# Patient Record
Sex: Female | Born: 1970 | Race: White | Hispanic: No | Marital: Married | Smoking: Never smoker
Health system: Southern US, Community
[De-identification: ages and names within clinical notes are randomized; demographics above are authoritative.]

## PROBLEM LIST (undated history)

## (undated) DIAGNOSIS — I10 Essential (primary) hypertension: Secondary | ICD-10-CM

## (undated) DIAGNOSIS — G4700 Insomnia, unspecified: Secondary | ICD-10-CM

## (undated) DIAGNOSIS — F909 Attention-deficit hyperactivity disorder, unspecified type: Secondary | ICD-10-CM

## (undated) DIAGNOSIS — M797 Fibromyalgia: Secondary | ICD-10-CM

## (undated) DIAGNOSIS — G43909 Migraine, unspecified, not intractable, without status migrainosus: Secondary | ICD-10-CM

## (undated) DIAGNOSIS — N3010 Interstitial cystitis (chronic) without hematuria: Secondary | ICD-10-CM

## (undated) DIAGNOSIS — F329 Major depressive disorder, single episode, unspecified: Secondary | ICD-10-CM

## (undated) DIAGNOSIS — F419 Anxiety disorder, unspecified: Secondary | ICD-10-CM

## (undated) DIAGNOSIS — N950 Postmenopausal bleeding: Secondary | ICD-10-CM

## (undated) DIAGNOSIS — F32A Depression, unspecified: Secondary | ICD-10-CM

## (undated) DIAGNOSIS — U071 COVID-19: Secondary | ICD-10-CM

## (undated) DIAGNOSIS — Z87891 Personal history of nicotine dependence: Secondary | ICD-10-CM

## (undated) DIAGNOSIS — N840 Polyp of corpus uteri: Secondary | ICD-10-CM

## (undated) DIAGNOSIS — M069 Rheumatoid arthritis, unspecified: Secondary | ICD-10-CM

## (undated) DIAGNOSIS — K219 Gastro-esophageal reflux disease without esophagitis: Secondary | ICD-10-CM

## (undated) DIAGNOSIS — I471 Supraventricular tachycardia: Secondary | ICD-10-CM

## (undated) DIAGNOSIS — G5603 Carpal tunnel syndrome, bilateral upper limbs: Secondary | ICD-10-CM

## (undated) DIAGNOSIS — I4719 Other supraventricular tachycardia: Secondary | ICD-10-CM

## (undated) DIAGNOSIS — I7300 Raynaud's syndrome without gangrene: Secondary | ICD-10-CM

## (undated) DIAGNOSIS — T7840XA Allergy, unspecified, initial encounter: Secondary | ICD-10-CM

## (undated) HISTORY — DX: Depression, unspecified: F32.A

## (undated) HISTORY — PX: SPINE SURGERY: SHX786

## (undated) HISTORY — DX: Insomnia, unspecified: G47.00

## (undated) HISTORY — PX: BREAST SURGERY: SHX581

## (undated) HISTORY — PX: OTHER SURGICAL HISTORY: SHX169

## (undated) HISTORY — DX: Anxiety disorder, unspecified: F41.9

## (undated) HISTORY — DX: Migraine, unspecified, not intractable, without status migrainosus: G43.909

## (undated) HISTORY — DX: Supraventricular tachycardia: I47.1

## (undated) HISTORY — DX: Attention-deficit hyperactivity disorder, unspecified type: F90.9

## (undated) HISTORY — DX: Rheumatoid arthritis, unspecified: M06.9

## (undated) HISTORY — DX: Other supraventricular tachycardia: I47.19

## (undated) HISTORY — DX: Major depressive disorder, single episode, unspecified: F32.9

## (undated) HISTORY — PX: AUGMENTATION MAMMAPLASTY: SUR837

## (undated) HISTORY — DX: Allergy, unspecified, initial encounter: T78.40XA

## (undated) HISTORY — PX: CYSTOSCOPY: SUR368

## (undated) HISTORY — DX: Interstitial cystitis (chronic) without hematuria: N30.10

## (undated) HISTORY — DX: Essential (primary) hypertension: I10

## (undated) HISTORY — PX: COSMETIC SURGERY: SHX468

---

## 1998-05-18 ENCOUNTER — Encounter (HOSPITAL_COMMUNITY): Admission: RE | Admit: 1998-05-18 | Discharge: 1998-08-16 | Payer: Self-pay | Admitting: Obstetrics and Gynecology

## 1998-06-04 ENCOUNTER — Inpatient Hospital Stay (HOSPITAL_COMMUNITY): Admission: AD | Admit: 1998-06-04 | Discharge: 1998-06-04 | Payer: Self-pay | Admitting: Obstetrics and Gynecology

## 1998-06-08 ENCOUNTER — Inpatient Hospital Stay (HOSPITAL_COMMUNITY): Admission: AD | Admit: 1998-06-08 | Discharge: 1998-06-08 | Payer: Self-pay | Admitting: Obstetrics and Gynecology

## 1998-06-09 ENCOUNTER — Inpatient Hospital Stay (HOSPITAL_COMMUNITY): Admission: AD | Admit: 1998-06-09 | Discharge: 1998-06-11 | Payer: Self-pay | Admitting: Obstetrics and Gynecology

## 1999-08-27 ENCOUNTER — Encounter: Admission: RE | Admit: 1999-08-27 | Discharge: 1999-08-27 | Payer: Self-pay | Admitting: Obstetrics and Gynecology

## 1999-08-27 ENCOUNTER — Encounter: Payer: Self-pay | Admitting: Obstetrics and Gynecology

## 2002-01-13 ENCOUNTER — Other Ambulatory Visit: Admission: RE | Admit: 2002-01-13 | Discharge: 2002-01-13 | Payer: Self-pay | Admitting: Obstetrics and Gynecology

## 2003-03-18 ENCOUNTER — Other Ambulatory Visit: Admission: RE | Admit: 2003-03-18 | Discharge: 2003-03-18 | Payer: Self-pay | Admitting: Obstetrics and Gynecology

## 2004-10-28 DIAGNOSIS — Z8614 Personal history of Methicillin resistant Staphylococcus aureus infection: Secondary | ICD-10-CM

## 2004-10-28 HISTORY — DX: Personal history of Methicillin resistant Staphylococcus aureus infection: Z86.14

## 2004-11-12 ENCOUNTER — Other Ambulatory Visit: Admission: RE | Admit: 2004-11-12 | Discharge: 2004-11-12 | Payer: Self-pay | Admitting: Obstetrics and Gynecology

## 2004-11-19 ENCOUNTER — Ambulatory Visit: Payer: Self-pay | Admitting: Family Medicine

## 2005-02-27 ENCOUNTER — Emergency Department (HOSPITAL_COMMUNITY): Admission: EM | Admit: 2005-02-27 | Discharge: 2005-02-27 | Payer: Self-pay | Admitting: Emergency Medicine

## 2005-07-11 ENCOUNTER — Emergency Department (HOSPITAL_COMMUNITY): Admission: EM | Admit: 2005-07-11 | Discharge: 2005-07-11 | Payer: Self-pay | Admitting: *Deleted

## 2006-01-16 ENCOUNTER — Ambulatory Visit: Payer: Self-pay | Admitting: Family Medicine

## 2006-09-21 ENCOUNTER — Emergency Department (HOSPITAL_COMMUNITY): Admission: EM | Admit: 2006-09-21 | Discharge: 2006-09-21 | Payer: Self-pay | Admitting: Emergency Medicine

## 2006-11-20 ENCOUNTER — Emergency Department (HOSPITAL_COMMUNITY): Admission: EM | Admit: 2006-11-20 | Discharge: 2006-11-20 | Payer: Self-pay | Admitting: Family Medicine

## 2006-12-16 ENCOUNTER — Ambulatory Visit: Payer: Self-pay | Admitting: Family Medicine

## 2007-05-07 ENCOUNTER — Ambulatory Visit: Payer: Self-pay | Admitting: Family Medicine

## 2007-05-07 LAB — CONVERTED CEMR LAB
ALT: 19 units/L (ref 0–35)
AST: 22 units/L (ref 0–37)
Basophils Relative: 0 % (ref 0.0–1.0)
Bilirubin, Direct: 0.1 mg/dL (ref 0.0–0.3)
CO2: 28 meq/L (ref 19–32)
Calcium: 8.6 mg/dL (ref 8.4–10.5)
Chloride: 101 meq/L (ref 96–112)
Eosinophils Relative: 1.1 % (ref 0.0–5.0)
Glucose, Bld: 92 mg/dL (ref 70–99)
HCT: 36.2 % (ref 36.0–46.0)
Lymphocytes Relative: 25.9 % (ref 12.0–46.0)
Platelets: 207 10*3/uL (ref 150–400)
RBC: 4.01 M/uL (ref 3.87–5.11)
Total Bilirubin: 0.7 mg/dL (ref 0.3–1.2)
Total Protein: 7 g/dL (ref 6.0–8.3)
Triglycerides: 68 mg/dL (ref 0–149)
VLDL: 14 mg/dL (ref 0–40)
WBC: 4.7 10*3/uL (ref 4.5–10.5)

## 2007-06-26 ENCOUNTER — Telehealth: Payer: Self-pay | Admitting: Family Medicine

## 2007-08-05 ENCOUNTER — Telehealth: Payer: Self-pay | Admitting: Family Medicine

## 2007-08-10 ENCOUNTER — Telehealth: Payer: Self-pay | Admitting: Family Medicine

## 2007-08-15 ENCOUNTER — Emergency Department (HOSPITAL_COMMUNITY): Admission: EM | Admit: 2007-08-15 | Discharge: 2007-08-15 | Payer: Self-pay | Admitting: Family Medicine

## 2007-08-17 ENCOUNTER — Telehealth: Payer: Self-pay | Admitting: Family Medicine

## 2007-10-17 ENCOUNTER — Emergency Department (HOSPITAL_COMMUNITY): Admission: EM | Admit: 2007-10-17 | Discharge: 2007-10-17 | Payer: Self-pay | Admitting: Emergency Medicine

## 2007-11-03 ENCOUNTER — Telehealth: Payer: Self-pay | Admitting: Family Medicine

## 2007-11-30 ENCOUNTER — Telehealth: Payer: Self-pay | Admitting: Family Medicine

## 2007-12-01 ENCOUNTER — Ambulatory Visit: Payer: Self-pay | Admitting: Family Medicine

## 2007-12-01 DIAGNOSIS — F32A Depression, unspecified: Secondary | ICD-10-CM | POA: Insufficient documentation

## 2007-12-01 DIAGNOSIS — J309 Allergic rhinitis, unspecified: Secondary | ICD-10-CM | POA: Insufficient documentation

## 2007-12-01 DIAGNOSIS — F411 Generalized anxiety disorder: Secondary | ICD-10-CM | POA: Insufficient documentation

## 2007-12-01 DIAGNOSIS — F329 Major depressive disorder, single episode, unspecified: Secondary | ICD-10-CM

## 2007-12-01 DIAGNOSIS — F909 Attention-deficit hyperactivity disorder, unspecified type: Secondary | ICD-10-CM | POA: Insufficient documentation

## 2008-01-21 ENCOUNTER — Telehealth: Payer: Self-pay | Admitting: Family Medicine

## 2008-02-03 ENCOUNTER — Telehealth: Payer: Self-pay | Admitting: Family Medicine

## 2008-02-15 ENCOUNTER — Telehealth: Payer: Self-pay | Admitting: Family Medicine

## 2008-05-03 ENCOUNTER — Telehealth: Payer: Self-pay | Admitting: Family Medicine

## 2008-06-11 ENCOUNTER — Emergency Department (HOSPITAL_COMMUNITY): Admission: EM | Admit: 2008-06-11 | Discharge: 2008-06-11 | Payer: Self-pay | Admitting: Family Medicine

## 2008-06-15 ENCOUNTER — Emergency Department (HOSPITAL_COMMUNITY): Admission: EM | Admit: 2008-06-15 | Discharge: 2008-06-15 | Payer: Self-pay | Admitting: Family Medicine

## 2008-06-23 ENCOUNTER — Telehealth: Payer: Self-pay | Admitting: Family Medicine

## 2008-07-11 ENCOUNTER — Telehealth: Payer: Self-pay | Admitting: Family Medicine

## 2008-07-13 ENCOUNTER — Ambulatory Visit: Payer: Self-pay | Admitting: Family Medicine

## 2008-07-25 ENCOUNTER — Emergency Department (HOSPITAL_COMMUNITY): Admission: EM | Admit: 2008-07-25 | Discharge: 2008-07-25 | Payer: Self-pay | Admitting: Family Medicine

## 2008-08-23 ENCOUNTER — Telehealth: Payer: Self-pay | Admitting: *Deleted

## 2008-09-09 ENCOUNTER — Telehealth: Payer: Self-pay | Admitting: *Deleted

## 2008-12-20 ENCOUNTER — Telehealth: Payer: Self-pay | Admitting: Family Medicine

## 2009-02-27 ENCOUNTER — Ambulatory Visit: Payer: Self-pay | Admitting: Family Medicine

## 2009-02-27 DIAGNOSIS — G4700 Insomnia, unspecified: Secondary | ICD-10-CM | POA: Insufficient documentation

## 2009-04-19 ENCOUNTER — Telehealth: Payer: Self-pay | Admitting: Family Medicine

## 2009-04-26 ENCOUNTER — Ambulatory Visit: Payer: Self-pay | Admitting: Family Medicine

## 2009-04-28 ENCOUNTER — Telehealth: Payer: Self-pay | Admitting: Family Medicine

## 2009-05-22 ENCOUNTER — Telehealth: Payer: Self-pay | Admitting: Family Medicine

## 2009-05-24 ENCOUNTER — Ambulatory Visit: Payer: Self-pay | Admitting: Family Medicine

## 2009-05-25 ENCOUNTER — Ambulatory Visit (HOSPITAL_COMMUNITY): Admission: RE | Admit: 2009-05-25 | Discharge: 2009-05-25 | Payer: Self-pay | Admitting: Family Medicine

## 2009-05-30 ENCOUNTER — Telehealth: Payer: Self-pay | Admitting: Family Medicine

## 2009-06-06 ENCOUNTER — Ambulatory Visit (HOSPITAL_COMMUNITY): Admission: RE | Admit: 2009-06-06 | Discharge: 2009-06-06 | Payer: Self-pay | Admitting: Family Medicine

## 2009-06-07 ENCOUNTER — Telehealth: Payer: Self-pay | Admitting: Family Medicine

## 2009-06-12 ENCOUNTER — Telehealth: Payer: Self-pay | Admitting: Family Medicine

## 2009-06-13 ENCOUNTER — Encounter: Payer: Self-pay | Admitting: Family Medicine

## 2009-06-20 ENCOUNTER — Ambulatory Visit (HOSPITAL_COMMUNITY): Admission: RE | Admit: 2009-06-20 | Discharge: 2009-06-20 | Payer: Self-pay | Admitting: Neurosurgery

## 2009-06-22 ENCOUNTER — Encounter: Payer: Self-pay | Admitting: Family Medicine

## 2009-06-26 ENCOUNTER — Telehealth: Payer: Self-pay | Admitting: Family Medicine

## 2009-08-18 ENCOUNTER — Telehealth: Payer: Self-pay | Admitting: Family Medicine

## 2009-09-05 ENCOUNTER — Telehealth: Payer: Self-pay | Admitting: Family Medicine

## 2009-09-13 ENCOUNTER — Emergency Department (HOSPITAL_COMMUNITY): Admission: EM | Admit: 2009-09-13 | Discharge: 2009-09-13 | Payer: Self-pay | Admitting: Family Medicine

## 2010-01-03 ENCOUNTER — Telehealth: Payer: Self-pay | Admitting: Family Medicine

## 2010-01-19 ENCOUNTER — Encounter: Payer: Self-pay | Admitting: Family Medicine

## 2010-01-29 ENCOUNTER — Telehealth: Payer: Self-pay | Admitting: Family Medicine

## 2010-01-29 ENCOUNTER — Telehealth (INDEPENDENT_AMBULATORY_CARE_PROVIDER_SITE_OTHER): Payer: Self-pay

## 2010-02-05 ENCOUNTER — Telehealth: Payer: Self-pay | Admitting: Family Medicine

## 2010-02-08 ENCOUNTER — Ambulatory Visit: Payer: Self-pay | Admitting: Family Medicine

## 2010-02-13 ENCOUNTER — Telehealth: Payer: Self-pay | Admitting: Family Medicine

## 2010-03-01 ENCOUNTER — Encounter: Payer: Self-pay | Admitting: Family Medicine

## 2010-03-06 ENCOUNTER — Telehealth: Payer: Self-pay | Admitting: Family Medicine

## 2010-03-07 ENCOUNTER — Encounter: Payer: Self-pay | Admitting: Family Medicine

## 2010-03-30 ENCOUNTER — Telehealth: Payer: Self-pay | Admitting: Family Medicine

## 2010-07-25 ENCOUNTER — Telehealth: Payer: Self-pay | Admitting: Family Medicine

## 2010-08-17 ENCOUNTER — Encounter: Payer: Self-pay | Admitting: Family Medicine

## 2010-10-12 ENCOUNTER — Telehealth: Payer: Self-pay | Admitting: Family Medicine

## 2010-10-12 ENCOUNTER — Ambulatory Visit: Payer: Self-pay | Admitting: Family Medicine

## 2010-10-23 ENCOUNTER — Telehealth: Payer: Self-pay | Admitting: Family Medicine

## 2010-11-13 ENCOUNTER — Ambulatory Visit
Admission: RE | Admit: 2010-11-13 | Discharge: 2010-11-13 | Payer: Self-pay | Source: Home / Self Care | Attending: Family Medicine | Admitting: Family Medicine

## 2010-11-29 NOTE — Assessment & Plan Note (Signed)
Summary: diarrhea/stomach pain/cjr   Vital Signs:  Patient profile:   40 year old female Weight:      127 pounds O2 Sat:      80 % Temp:     98.2 degrees F Pulse rate:   95 / minute BP sitting:   120 / 80  (left arm) Cuff size:   regular  Vitals Entered By: Pura Spice, RN (November 13, 2010 2:39 PM) CC: nausea diarrhea   History of Present Illness: Here with 3 days of nausea and vomiting and diarrhea . She feels much better today, drinking fluids. No fever. Also she asks about a med for anxiety. She had used Lexapro in the past, but now she wants to try a med that she can use on an as needed basis only. She has stopped all her ADHD meds as well, and has done very well.   Allergies: 1)  ! Darvocet 2)  ! Vicodin 3)  ! Vioxx  Past History:  Past Medical History: Reviewed history from 02/27/2009 and no changes required. Allergic rhinitis Anxiety Depression ADHD insomnia  Past Surgical History: Reviewed history from 12/01/2007 and no changes required. Breast implants 1996  Review of Systems  The patient denies anorexia, fever, weight loss, weight gain, vision loss, decreased hearing, hoarseness, chest pain, syncope, dyspnea on exertion, peripheral edema, prolonged cough, headaches, hemoptysis, abdominal pain, melena, hematochezia, severe indigestion/heartburn, hematuria, incontinence, genital sores, muscle weakness, suspicious skin lesions, transient blindness, difficulty walking, depression, unusual weight change, abnormal bleeding, enlarged lymph nodes, angioedema, breast masses, and testicular masses.    Physical Exam  General:  Well-developed,well-nourished,in no acute distress; alert,appropriate and cooperative throughout examination Abdomen:  Bowel sounds positive,abdomen soft and non-tender without masses, organomegaly or hernias noted. Psych:  Cognition and judgment appear intact. Alert and cooperative with normal attention span and concentration. No apparent  delusions, illusions, hallucinations   Impression & Recommendations:  Problem # 1:  GASTROENTERITIS, VIRAL (ICD-008.8)  Problem # 2:  ADHD (ICD-314.01)  Problem # 3:  ANXIETY (ICD-300.00)  The following medications were removed from the medication list:    Lexapro 10 Mg Tabs (Escitalopram oxalate) .Marland Kitchen... 1 once daily Her updated medication list for this problem includes:    Ativan 1 Mg Tabs (Lorazepam) .Marland Kitchen... Three times a day as needed anxiety  Complete Medication List: 1)  Ambien 10 Mg Tabs (Zolpidem tartrate) .... At bedtime 2)  Valtrex 500 Mg Tabs (Valacyclovir hcl) .... One by mouth bid 3)  Diflucan 100 Mg Tabs (Fluconazole) .Marland Kitchen.. 1 by mouth two times a day as needed 4)  Flexeril 10 Mg Tabs (Cyclobenzaprine hcl) .... Three times a day as needed spasm 5)  Ativan 1 Mg Tabs (Lorazepam) .... Three times a day as needed anxiety  Patient Instructions: 1)  Drink fluids, rest. Try Lorazepam.  Prescriptions: ATIVAN 1 MG TABS (LORAZEPAM) three times a day as needed anxiety  #90 x 0   Entered and Authorized by:   Nelwyn Salisbury MD   Signed by:   Nelwyn Salisbury MD on 11/13/2010   Method used:   Print then Give to Patient   RxID:   [id number]    Orders Added: 1)  Est. Patient Level IV [41324]

## 2010-11-29 NOTE — Progress Notes (Signed)
Summary: pharmacy update  Phone Note Call from Patient Call back at Home Phone [phone]   Caller: Patient---live call Summary of Call: please call zpak to [facility] Initial call taken by: Warnell Forester,  January 29, 2010 2:34 PM    Prescriptions: Christena Deem Z-PAK 250 MG TABS (AZITHROMYCIN) as directed  #1 x 0   Entered by:   Duard Brady LPN   Authorized by:   Nelwyn Salisbury MD   Signed by:   Duard Brady LPN on 09/81/1914   Method used:   Electronically to        [facility]* (retail)       [address]       [address] Shipping/mailing       [place], [place]  [address]       Ph: [phone]       Fax: [phone]   RxID:   [id number]  [facility] KIK

## 2010-11-29 NOTE — Progress Notes (Signed)
Summary: Lexapro  Phone Note Call from Patient   Summary of Call: Pt would like to go back on Lexapro 10 mg with a 90 day supply to [facility] and call pt when done. [phone] Initial call taken by: Lynann Beaver CMA,  February 05, 2010 10:13 AM  Follow-up for Phone Call        call in #90 with 3 rf Follow-up by: Nelwyn Salisbury MD,  February 05, 2010 3:29 PM  Additional Follow-up for Phone Call Additional follow up Details #1::        Rx Called In Additional Follow-up by: Raechel Ache, RN,  February 05, 2010 4:19 PM    New/Updated Medications: LEXAPRO 10 MG TABS (ESCITALOPRAM OXALATE) 1 once daily Prescriptions: LEXAPRO 10 MG TABS (ESCITALOPRAM OXALATE) 1 once daily  #90 x 3   Entered by:   Raechel Ache, RN   Authorized by:   Nelwyn Salisbury MD   Signed by:   Raechel Ache, RN on 02/05/2010   Method used:   Electronically to        [facility]* (retail)       [address]       [address] Shipping/mailing       [place], [place]  [address]       Ph: [phone]       Fax: [phone]   RxID:   [id number]

## 2010-11-29 NOTE — Letter (Signed)
Summary: [facility]  [facility]   Imported By: Maryln Gottron 08/21/2010 10:38:45  _____________________________________________________________________  External Attachment:    Type:   Image     Comment:   External Document

## 2010-11-29 NOTE — Letter (Signed)
Summary: [facility]  [facility]   Imported By: Maryln Gottron 03/14/2010 13:33:32  _____________________________________________________________________  External Attachment:    Type:   Image     Comment:   External Document

## 2010-11-29 NOTE — Consult Note (Signed)
Summary: [facility]  [facility]   Imported By: Maryln Gottron 03/07/2010 14:48:00  _____________________________________________________________________  External Attachment:    Type:   Image     Comment:   External Document

## 2010-11-29 NOTE — Progress Notes (Signed)
Summary: zpak  Phone Note Call from Patient Call back at Home Phone [phone]   Caller: vm Summary of Call: Requesting Zpak again for sinus infection.  Happens every 4 - 5 months.  [facility] OP.  Please let me know.   Initial call taken by: Rudy Jew, RN,  January 29, 2010 12:35 PM  Follow-up for Phone Call        call in a Zpack Follow-up by: Nelwyn Salisbury MD,  January 29, 2010 1:12 PM  Additional Follow-up for Phone Call Additional follow up Details #1::        attempt to call - ans mach - LMTCB if needed , Dr. Clent Ridges ok'd Z-pak to be called out , [facility]. if no inprovement after abx - may need to be seen .KIK Additional Follow-up by: Duard Brady LPN,  January 29, 2010 1:47 PM    New/Updated Medications: ZITHROMAX Z-PAK 250 MG TABS (AZITHROMYCIN) as directed Prescriptions: ZITHROMAX Z-PAK 250 MG TABS (AZITHROMYCIN) as directed  #1 x 0   Entered by:   Duard Brady LPN   Authorized by:   Nelwyn Salisbury MD   Signed by:   Duard Brady LPN on 65/78/4696   Method used:   [facility] [phone]* (retail)       [address]       [place], [place]  [address]       Ph: [phone] or [phone]       Fax: [phone]   RxID:   [id number]

## 2010-11-29 NOTE — Progress Notes (Signed)
Summary: REFILL  Phone Note Refill Request Call back at Home Phone [phone] Message from:  Patient---LIVE CALL  Refills Requested: Medication #1:  VALTREX 500 MG TABS one by mouth bid [facility]. REQUESTING A FULL RX. IT IS CHEAPER THAN 2 OR 3 PILLLS AT A TIME. THANKS.  Initial call taken by: Warnell Forester,  January 03, 2010 8:07 AM  Follow-up for Phone Call        Rx called to pharmacy Follow-up by: Alfred Levins, CMA,  January 03, 2010 10:06 AM    Prescriptions: VALTREX 500 MG TABS (VALACYCLOVIR HCL) one by mouth bid  #60 x 11   Entered by:   Alfred Levins, CMA   Authorized by:   Nelwyn Salisbury MD   Signed by:   Alfred Levins, CMA on 01/03/2010   Method used:   Electronically to        [facility]* (retail)       [address]       [address] Shipping/mailing       [place], [place]  [address]       Ph: [phone]       Fax: [phone]   RxID:   [id number]

## 2010-11-29 NOTE — Assessment & Plan Note (Signed)
Summary: fup meds//ccm   Vital Signs:  Patient profile:   40 year old female Weight:      133 pounds BP sitting:   126 / 78  (left arm) Cuff size:   regular  Vitals Entered By: Raechel Ache, RN (February 08, 2010 10:59 AM) CC: Talk about meds- depressed.   History of Present Illness: Here to follow up on anxiety, depression, and ADHD. She is still going through her divorce, and of course this has been difficult, Plus she was recently told by her 44 year old son that he now wants to live with his father instead of her. This was very difficult news for her to deal with. She has been very tearful, can't relax, can't sleep, can't focus on her work. She would like for [place] to start treating her ADHD again also.   Allergies: 1)  ! Darvocet 2)  ! Vicodin 3)  ! Vioxx  Past History:  Past Medical History: Reviewed history from 02/27/2009 and no changes required. Allergic rhinitis Anxiety Depression ADHD insomnia  Review of Systems  The patient denies anorexia, fever, weight loss, weight gain, vision loss, decreased hearing, hoarseness, chest pain, syncope, dyspnea on exertion, peripheral edema, prolonged cough, headaches, hemoptysis, abdominal pain, melena, hematochezia, severe indigestion/heartburn, hematuria, incontinence, genital sores, muscle weakness, suspicious skin lesions, transient blindness, difficulty walking, unusual weight change, abnormal bleeding, enlarged lymph nodes, angioedema, breast masses, and testicular masses.    Physical Exam  General:  Well-developed,well-nourished,in no acute distress; alert,appropriate and cooperative throughout examination Psych:  Oriented X3, memory intact for recent and remote, normally interactive, good eye contact, tearful, and slightly anxious.     Impression & Recommendations:  Problem # 1:  ADHD (ICD-314.01)  Problem # 2:  DEPRESSION (ICD-311)  Her updated medication list for this problem includes:    Lexapro 10 Mg Tabs  (Escitalopram oxalate) .Marland Kitchen... 1 once daily  Problem # 3:  ANXIETY (ICD-300.00)  Her updated medication list for this problem includes:    Lexapro 10 Mg Tabs (Escitalopram oxalate) .Marland Kitchen... 1 once daily  Complete Medication List: 1)  Ambien 10 Mg Tabs (Zolpidem tartrate) .... 2 at bedtime 2)  Valtrex 500 Mg Tabs (Valacyclovir hcl) .... One by mouth bid 3)  Diflucan 100 Mg Tabs (Fluconazole) .Marland Kitchen.. 1 by mouth two times a day as needed 4)  Flexeril 10 Mg Tabs (Cyclobenzaprine hcl) .... Three times a day as needed spasm 5)  Adderall Xr 20 Mg Xr24h-cap (Amphetamine-dextroamphetamine) .... Once daily 6)  Lexapro 10 Mg Tabs (Escitalopram oxalate) .Marland Kitchen.. 1 once daily  Patient Instructions: 1)  Please schedule a follow-up appointment in 1 month.  Prescriptions: ADDERALL XR 20 MG XR24H-CAP (AMPHETAMINE-DEXTROAMPHETAMINE) once daily  #90 x 0   Entered and Authorized by:   Nelwyn Salisbury MD   Signed by:   Nelwyn Salisbury MD on 02/08/2010   Method used:   Print then Give to Patient   RxID:   [id number]

## 2010-11-29 NOTE — Progress Notes (Signed)
Summary: antibiotic & pain med  Phone Note Call from Patient Call back at [facility] Phone [phone]   Summary of Call: Can you call in Rx for bladder infection?   Frequency, burning, spasm, as before, & Pain med, 10 on scale 1 - 10.  2 weeks, has ignored it and now really bad.  Sinus infection antibiotic didn't help as she thought it would.  [facility] OP.  [facility]DA.  She wants a call back either way.   Initial call taken by: Rudy Jew, RN,  February 13, 2010 9:50 AM  Follow-up for Phone Call        call in Bactrim DS two times a day for 7 days, also Pyridium 200mg  three times a day as needed urinary pain, #30 with 2 rf Follow-up by: Nelwyn Salisbury MD,  February 13, 2010 1:06 PM  Additional Follow-up for Phone Call Additional follow up Details #1::        Phone Call Completed.  Left message patient's number to inform.   Additional Follow-up by: Rudy Jew, RN,  February 13, 2010 2:13 PM    New/Updated Medications: BACTRIM DS 800-160 MG TABS (SULFAMETHOXAZOLE-TRIMETHOPRIM) One two times a day for 7 days PYRIDIUM 200 MG TABS (PHENAZOPYRIDINE HCL) One three times a day as needed urinary pain Prescriptions: PYRIDIUM 200 MG TABS (PHENAZOPYRIDINE HCL) One three times a day as needed urinary pain  #30 x 2   Entered by:   Rudy Jew, RN   Authorized by:   Nelwyn Salisbury MD   Signed by:   Rudy Jew, RN on 02/13/2010   Method used:   Electronically to        [facility]* (retail)       [address]       [address] Shipping/mailing       [place], [place]  [address]       Ph: [phone]       Fax: [phone]   RxID:   [id number] BACTRIM DS 800-160 MG TABS (SULFAMETHOXAZOLE-TRIMETHOPRIM) One two times a day for 7 days  #14 x 0   Entered by:   Rudy Jew, RN   Authorized by:   Nelwyn Salisbury MD   Signed by:   Rudy Jew, RN on 02/13/2010   Method used:   Electronically to        [facility]* (retail)  [address]       [address] Shipping/mailing       [place], [place]  [address]       Ph: [phone]       Fax: [phone]   RxID:   [id number]

## 2010-11-29 NOTE — Progress Notes (Signed)
Summary: sinus infection  Phone Note Call from Patient   Caller: Patient Call For: Nelwyn Salisbury MD Reason for Call: Acute Illness Complaint: Cough/Sore throat, Headache Action Taken: Provider Notified Summary of Call: Pt is having congestion in face and teeth pain.  Runny nose.  x 2 weeks.  Green mucus from nose.  [name]  [address]  [phone] Initial call taken by: Lynann Beaver CMA,  July 25, 2010 8:38 AM  Follow-up for Phone Call        call in a Zpack Follow-up by: Nelwyn Salisbury MD,  July 25, 2010 9:11 AM  Additional Follow-up for Phone Call Additional follow up Details #1::        done  pt notified--lmom that rx had been  called in,  Additional Follow-up by: Pura Spice, RN,  July 25, 2010 9:54 AM    New/Updated Medications: AZITHROMYCIN 250 MG  TABS (AZITHROMYCIN) 2 by  mouth today and then 1 daily for 4 days Prescriptions: AZITHROMYCIN 250 MG  TABS (AZITHROMYCIN) 2 by  mouth today and then 1 daily for 4 days  #6 x 0   Entered by:   Pura Spice, RN   Authorized by:   Nelwyn Salisbury MD   Signed by:   Pura Spice, RN on 07/25/2010   Method used:   Electronically to        [id number] (retail)       [address]       [place], [place]  [address]       Ph: [phone]       Fax: [phone]   RxID:   [id number]

## 2010-11-29 NOTE — Progress Notes (Signed)
Summary: Zolpidem refill request  Phone Note Call from Patient Call back at Home Phone [phone]   Caller: Patient Call For: Martha Salisbury MD Summary of Call: Pt requesting refill Zolpidem.  She is completely out [facility] Initial call taken by: Sid Falcon LPN,  October 23, 2010 3:53 PM  Follow-up for Phone Call        call in #60 with 5 rf  Follow-up by: Martha Salisbury MD,  October 23, 2010 5:21 PM  Additional Follow-up for Phone Call Additional follow up Details #1::        Rx called, pt informed Additional Follow-up by: Sid Falcon LPN,  October 23, 2010 5:32 PM    Prescriptions: AMBIEN 10 MG TABS (ZOLPIDEM TARTRATE) 2 at bedtime  #60 x 5   Entered by:   Sid Falcon LPN   Authorized by:   Martha Salisbury MD   Signed by:   Sid Falcon LPN on 09/81/1914   Method used:   Telephoned to ...       [facility]  [phone]* (retail)       [address] [place], [place]  [address]       Ph: [phone] or [phone]       Fax: [phone]   RxID:   [id number]

## 2010-11-29 NOTE — Letter (Signed)
Summary: [facility]  [facility]   Imported By: Maryln Gottron 01/29/2010 15:41:39  _____________________________________________________________________  External Attachment:    Type:   Image     Comment:   External Document

## 2010-11-29 NOTE — Progress Notes (Signed)
Summary: vyvanse  Phone Note Call from Patient Call back at Home Phone [phone]   Caller: vm Summary of Call: Requests to be taken off Adderall & be put on Vyvanse. It causes heart palpitations and jittery in pms.  She will pick up Rx. Initial call taken by: Rudy Jew, RN,  March 30, 2010 2:27 PM  Follow-up for Phone Call        done  Follow-up by: Nelwyn Salisbury MD,  March 30, 2010 3:30 PM    New/Updated Medications: VYVANSE 20 MG CAPS (LISDEXAMFETAMINE DIMESYLATE) once daily Prescriptions: VYVANSE 20 MG CAPS (LISDEXAMFETAMINE DIMESYLATE) once daily  #30 x 0   Entered and Authorized by:   Nelwyn Salisbury MD   Signed by:   Nelwyn Salisbury MD on 03/30/2010   Method used:   Print then Give to Patient   RxID:   [id number]

## 2010-11-29 NOTE — Progress Notes (Signed)
Summary: Pt wants to change from Adderall to VyvanseLMTCB 5-10  Phone Note Call from Patient Call back at [facility] Phone [phone]   Caller: Patient Summary of Call: Pt called and said that Adderall needs to be changed to Vyvanse, because Adderall is not XR like Vyanse is.   Please call when ready for pick up.  Initial call taken by: Lucy Antigua,  Mar 06, 2010 9:55 AM  Follow-up for Phone Call        No, I did give her XR Adderall already Follow-up by: Nelwyn Salisbury MD,  Mar 06, 2010 11:57 AM  Additional Follow-up for Phone Call Additional follow up Details #1::        Left message to call back. Raelene Bott Spell, RN  Mar 06, 2010 12:48 PM  Spoke to pt regarding meds, and she is needing refills on Adderal.   Additional Follow-up by: Lynann Beaver CMA,  Mar 07, 2010 10:12 AM    Additional Follow-up for Phone Call Additional follow up Details #2::    Needs refill on Adderal XR.  Additional Follow-up for Phone Call Additional follow up Details #3:: Details for Additional Follow-up Action Taken: done for one month Additional Follow-up by: Nelwyn Salisbury MD,  Mar 07, 2010 12:17 PM  Prescriptions: ADDERALL XR 20 MG XR24H-CAP (AMPHETAMINE-DEXTROAMPHETAMINE) once daily  #30 x 0   Entered and Authorized by:   Nelwyn Salisbury MD   Signed by:   Nelwyn Salisbury MD on 03/07/2010   Method used:   Print then Give to Patient   RxID:   [id number]

## 2010-11-29 NOTE — Progress Notes (Signed)
Summary: ? hand broken  Phone Note Call from Patient Call back at Home Phone [phone]   Caller: Patient--live call Summary of Call: thinks she may have broken her hand. Has appt with Dr Clent Ridges for 2:45 pm today. please advise patient. Initial call taken by: Warnell Forester,  October 12, 2010 9:06 AM  Follow-up for Phone Call        see if [facility] work her [facility] today for hand injury Follow-up by: Nelwyn Salisbury MD,  October 12, 2010 9:25 AM  Additional Follow-up for Phone Call Additional follow up Details #1::        I called [facility] and they said that they could get her in but she needs to discuss this with Misty Stanley in billing before they could see her.  Pt aware of this and will call them. Appt cancelled with Dr. Clent Ridges for today. Additional Follow-up by: Romualdo Bolk, CMA Duncan Dull),  October 12, 2010 9:36 AM    Additional Follow-up for Phone Call Additional follow up Details #2::    noted Follow-up by: Nelwyn Salisbury MD,  October 12, 2010 9:58 AM

## 2010-12-09 ENCOUNTER — Inpatient Hospital Stay (INDEPENDENT_AMBULATORY_CARE_PROVIDER_SITE_OTHER)
Admission: RE | Admit: 2010-12-09 | Discharge: 2010-12-09 | Disposition: A | Discharge status: Home / Self Care | Payer: 59 | Source: Ambulatory Visit | Attending: Emergency Medicine | Admitting: Emergency Medicine

## 2010-12-09 DIAGNOSIS — S20229A Contusion of unspecified back wall of thorax, initial encounter: Secondary | ICD-10-CM

## 2010-12-12 ENCOUNTER — Telehealth: Payer: Self-pay | Admitting: Family Medicine

## 2010-12-12 MED ORDER — AZITHROMYCIN 250 MG PO TABS: ORAL_TABLET | ORAL | Status: AC

## 2010-12-12 NOTE — Telephone Encounter (Signed)
Requesting antibiotic for sinus infection.  Please send to [facility].

## 2010-12-12 NOTE — Telephone Encounter (Signed)
done

## 2010-12-20 ENCOUNTER — Ambulatory Visit (INDEPENDENT_AMBULATORY_CARE_PROVIDER_SITE_OTHER): Payer: 59 | Admitting: Family Medicine

## 2010-12-20 ENCOUNTER — Encounter: Payer: Self-pay | Admitting: Family Medicine

## 2010-12-20 ENCOUNTER — Ambulatory Visit (INDEPENDENT_AMBULATORY_CARE_PROVIDER_SITE_OTHER)
Admission: RE | Admit: 2010-12-20 | Discharge: 2010-12-20 | Disposition: A | Discharge status: Home / Self Care | Payer: 59 | Source: Ambulatory Visit | Attending: Family Medicine | Admitting: Family Medicine

## 2010-12-20 VITALS — BP 110/80 | HR 109 | Temp 98.0°F | Wt 126.0 lb

## 2010-12-20 DIAGNOSIS — M545 Low back pain, unspecified: Secondary | ICD-10-CM

## 2010-12-20 NOTE — Progress Notes (Signed)
  Subjective:    Patient ID: Martha [name], female    DOB: April 01, 1971, 40 y.o.   MRN: [id number]  HPI Here for low back pain which is still present one month after an injury. On 11-17-10 while getting out of the bathtub at home, she slipped and fell, landing on her lower back and right buttock. She has some swelling which went away after a week or so, but she had a lot of pain in this area that persists. She was seen at Urgent Care on 12-09-10, and was told it was just a contusion. No Xrays were obtained. She was given a steroid dose pack and Percocet. She did not take the Percocet but did take the steroid pack. This helped a tiny bit only. Now she is taking Motrin which helps a little better. The pain has improved slowly but it still bothers her a lot. No pain down the legs, no numbness or weakness.    Review of Systems  Constitutional: Negative.   Musculoskeletal: Negative for myalgias, joint swelling, arthralgias and gait problem.  Neurological: Negative for weakness and numbness.       Objective:   Physical Exam  Constitutional: She appears well-developed and well-nourished.       Gets up and down from the exam table easily   Musculoskeletal:       Very tender over the right lower back at the level of L5 or S1. No spasm. Full ROM. Negative SLR           Assessment & Plan:  This appears to be simply a deep bruise that is lowly healing. We will get Xrays today to rule out fractures. She will switch to taking 2 Aleeve tablets twice a day. Continue light duty at work for another week.

## 2010-12-21 ENCOUNTER — Telehealth: Payer: Self-pay | Admitting: Family Medicine

## 2010-12-21 NOTE — Telephone Encounter (Signed)
Pt notified xr results

## 2010-12-21 NOTE — Telephone Encounter (Signed)
Normal

## 2010-12-21 NOTE — Telephone Encounter (Signed)
Pt called to request results of recent xrays.... Pt can be reached at [phone].

## 2010-12-27 ENCOUNTER — Other Ambulatory Visit: Payer: Self-pay | Admitting: Family Medicine

## 2010-12-28 ENCOUNTER — Telehealth: Payer: Self-pay | Admitting: *Deleted

## 2010-12-28 MED ORDER — CIPROFLOXACIN HCL 500 MG PO TABS: 500.0000 mg | ORAL_TABLET | Freq: Two times a day (BID) | ORAL | Status: AC

## 2010-12-28 NOTE — Telephone Encounter (Signed)
Pt has a UTI and wants a rx called into [facility]

## 2010-12-28 NOTE — Telephone Encounter (Signed)
Call in #90 with 5 rf 

## 2010-12-28 NOTE — Telephone Encounter (Signed)
Pt aware.

## 2010-12-28 NOTE — Telephone Encounter (Signed)
Tell her these have been emailed in

## 2011-01-02 ENCOUNTER — Telehealth: Payer: Self-pay | Admitting: Family Medicine

## 2011-01-02 NOTE — Telephone Encounter (Signed)
Triage vm-----Dr Clent Ridges suggested light duty. She has been on light duty. [facility] needs a letter stating that she was on light duty under his care from last week to this week. Can pick up letter tomorrow.  Refill Lorazepam @ [facility]

## 2011-01-02 NOTE — Telephone Encounter (Signed)
Please fax in such a work note,also call in  6 month supply of Lorazepam

## 2011-01-03 ENCOUNTER — Other Ambulatory Visit: Payer: Self-pay | Admitting: Family Medicine

## 2011-01-03 NOTE — Telephone Encounter (Signed)
rx called in and letter ready

## 2011-01-03 NOTE — Telephone Encounter (Signed)
Pt called to adv she needs a refill on med: Lorazepam 1mg , Psychologist, forensic - [facility]...Marland KitchenMarland KitchenMarland Kitchen Pt also needs a note explaining that she was on light duty last week and this week for her back.... Pt will p/u note once it is ready... Pt # [id number].

## 2011-01-07 ENCOUNTER — Other Ambulatory Visit: Payer: Self-pay | Admitting: Family Medicine

## 2011-01-07 NOTE — Telephone Encounter (Signed)
Martha [name] called and told patient on last Thursday to tell her that her Lorazepam was ready at [facility]. As of today, no rx is there. Wants [name] to return call.

## 2011-01-08 MED ORDER — LORAZEPAM 1 MG PO TABS: 1.0000 mg | ORAL_TABLET | Freq: Three times a day (TID) | ORAL | Status: DC | PRN

## 2011-01-08 NOTE — Telephone Encounter (Signed)
Left mess on cell phone med called in again as [facility]  [facility] rx given

## 2011-01-30 LAB — POCT I-STAT, CHEM 8
Creatinine, Ser: 0.7 mg/dL (ref 0.4–1.2)
Glucose, Bld: 94 mg/dL (ref 70–99)
Hemoglobin: 13.6 g/dL (ref 12.0–15.0)
TCO2: 26 mmol/L (ref 0–100)

## 2011-02-04 ENCOUNTER — Telehealth: Payer: Self-pay | Admitting: Family Medicine

## 2011-02-04 MED ORDER — ZOLPIDEM TARTRATE 10 MG PO TABS: 10.0000 mg | ORAL_TABLET | Freq: Every evening | ORAL | Status: DC | PRN

## 2011-02-04 NOTE — Telephone Encounter (Signed)
Call in 10 mg, one qhs, #30 with 5 rf

## 2011-02-04 NOTE — Telephone Encounter (Signed)
Pt aware  rx called,

## 2011-02-04 NOTE — Telephone Encounter (Signed)
Pt lost her rx that she had filled for Zolpidem, while her car was been detailed (Cleaning). [facility]. She said that it was not stolen, just accidentialy thrown away. Please call her when done. She is out of meds.

## 2011-02-11 ENCOUNTER — Encounter (HOSPITAL_COMMUNITY): Payer: 59

## 2011-02-11 ENCOUNTER — Other Ambulatory Visit (HOSPITAL_COMMUNITY): Payer: 59

## 2011-03-12 NOTE — Assessment & Plan Note (Signed)
[facility] OFFICE NOTE   NAME:Martha [name], Martha [name]                       MRN:          [id number]  DATE:05/07/2007                            DOB:          [birth year]    This is a 40 year old woman here for a non-gynecological physical  examination. 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 She feels fine and has no complaints at  all. She continues to see Dr. Ambrose Mantle on a regular basis for gynecology  exams. Her anxiety remains under good control, as do her migraine  headaches. For details for her past medical, family history, social  history, habits, etcetera, I refer you to our introductory note with her  dated January 06, 2002.   ALLERGIES:  DARVOCET, VICODIN, and VIOXX.   CURRENT MEDICATIONS:  1. Lexapro 10 mg daily.  2. Ambien 10 mg nightly as needed for sleep.  3. Zomig 5 mg as needed for migraines.   OBJECTIVE:  VITAL SIGNS:  Height 5 foot 7 inches, weight 171, blood  pressure 118/82, pulse 72 and regular.  GENERAL:  She appears healthy.  SKIN:  Clear.  EYES:  Clear. Vision uncorrected in both eyes is 20/13.  EARS:  Hearing is grossly normal. Ears are clear.  OROPHARYNX:  Clear.  NECK:  Supple without lymphadenopathy or masses.  LUNGS:  Clear.  CARDIAC:  Rhythm is regular with no murmurs, rubs, or gallops. Distal  pulses are full.  ABDOMEN:  Soft, normal bowel sounds, nontender, no masses.  EXTREMITIES:  No cyanosis, clubbing, or edema.  NEUROLOGIC:  Grossly intact.   ASSESSMENT AND PLAN:  1. Complete physical. She is fasting, so we will send her for the      usual laboratories.  2. Application for classes as above. All of her forms were filled out.      She is up-to-date on immunizations, although she will get her third      and final hepatitis B shot at work on July 18.  3. Anxiety,  stable.  4. Insomnia, stable.  5. Migraine headaches, stable.    Tera Mater. Clent Ridges, MD  Electronically Signed   SAF/MedQ  DD: 05/07/2007  DT: 05/08/2007  Job #: [id number]

## 2011-05-07 ENCOUNTER — Other Ambulatory Visit: Payer: Self-pay | Admitting: Family Medicine

## 2011-05-08 NOTE — Telephone Encounter (Signed)
Pt last here on 12/20/10 and last filled on 02/04/11

## 2011-05-10 NOTE — Telephone Encounter (Signed)
Dr fry should do long term refills  If needs some to get by until her returns  then can rx #20  Pills of ambien 10 mg

## 2011-05-13 NOTE — Telephone Encounter (Signed)
Refill request for Zolpidem 10 mg, pt last here on 12/20/10 and script last filled on 02/04/11.

## 2011-05-14 NOTE — Telephone Encounter (Signed)
She takes 2 qhs, so call in #180 with one rf

## 2011-05-26 ENCOUNTER — Inpatient Hospital Stay (INDEPENDENT_AMBULATORY_CARE_PROVIDER_SITE_OTHER)
Admission: RE | Admit: 2011-05-26 | Discharge: 2011-05-26 | Disposition: A | Discharge status: Home / Self Care | Payer: 59 | Source: Ambulatory Visit | Attending: Family Medicine | Admitting: Family Medicine

## 2011-05-26 DIAGNOSIS — B009 Herpesviral infection, unspecified: Secondary | ICD-10-CM

## 2011-05-30 ENCOUNTER — Telehealth: Payer: Self-pay | Admitting: Family Medicine

## 2011-05-30 ENCOUNTER — Ambulatory Visit (INDEPENDENT_AMBULATORY_CARE_PROVIDER_SITE_OTHER): Payer: 59 | Admitting: Family Medicine

## 2011-05-30 ENCOUNTER — Encounter: Payer: Self-pay | Admitting: Family Medicine

## 2011-05-30 DIAGNOSIS — R079 Chest pain, unspecified: Secondary | ICD-10-CM

## 2011-05-30 DIAGNOSIS — B029 Zoster without complications: Secondary | ICD-10-CM

## 2011-05-30 NOTE — Telephone Encounter (Signed)
Pt is faxing over FMLA papers. Pt requesting DR. Clent Ridges to include next week as well on the papers incase she is feel no better. Pt is faxing today.

## 2011-05-30 NOTE — Progress Notes (Signed)
  Subjective:    Patient ID: Martha [name], female    DOB: 12-01-1970, 40 y.o.   MRN: [id number]  HPI Here to follow up on shingles and for recent chest pressure and pain. About one week ago she developed some burning and tingling in the mouth and the left side of her face. This became more severe and then she noticed a few blisters come up on the lower lip. This was also associated with sharp pains down the left arm. She went to Urgent Care on 05-26-11 and was diagnosed with shingles. She was given a 7 day course of Valtrex 1000 mg tid. Now the facial pain and the burning are much better, but she has had several days of a pressure like pain in the chest, some heartburn, and some mild SOB. No fever or cough. No nausea or vomiting.    Review of Systems  Constitutional: Negative.   HENT: Negative.   Respiratory: Positive for chest tightness and shortness of breath. Negative for cough, choking and wheezing.   Cardiovascular: Positive for chest pain. Negative for palpitations and leg swelling.  Gastrointestinal: Negative.        Objective:   Physical Exam  Constitutional: She appears well-developed and well-nourished.  HENT:  Right Ear: External ear normal.  Left Ear: External ear normal.  Nose: Nose normal.  Mouth/Throat: No oropharyngeal exudate.       Several small ulcerated lesions on the lower lip  Eyes: Conjunctivae are normal. Pupils are equal, round, and reactive to light.  Neck: No thyromegaly present.  Cardiovascular: Normal rate, regular rhythm, normal heart sounds and intact distal pulses.  Exam reveals no gallop and no friction rub.   No murmur heard.      EKG normal with a few PACs  Pulmonary/Chest: Effort normal and breath sounds normal. No respiratory distress. She has no wheezes. She has no rales. She exhibits no tenderness.  Lymphadenopathy:    She has no cervical adenopathy.  Skin: No rash noted. No erythema.          Assessment & Plan:  She is recovering well  from a case of orofacial shingles, and she will finish out the course of Valtrex. I think the pain and the stress of this has caused some GERD, which is responsible for the chest symptoms. She will take Prilosec OTC for a week. She is out of work this week until 06-03-11.

## 2011-05-31 NOTE — Telephone Encounter (Signed)
Will do this 

## 2011-06-05 ENCOUNTER — Other Ambulatory Visit: Payer: Self-pay | Admitting: Family Medicine

## 2011-06-05 NOTE — Telephone Encounter (Signed)
Last filled 05/05/11. Last OV 05/30/11

## 2011-06-06 NOTE — Telephone Encounter (Signed)
Call in #90 with 5 rf 

## 2011-06-07 NOTE — Telephone Encounter (Signed)
rx called into pharmacy

## 2011-07-16 ENCOUNTER — Ambulatory Visit (INDEPENDENT_AMBULATORY_CARE_PROVIDER_SITE_OTHER): Payer: 59 | Admitting: Family Medicine

## 2011-07-16 DIAGNOSIS — J019 Acute sinusitis, unspecified: Secondary | ICD-10-CM

## 2011-07-16 DIAGNOSIS — J0100 Acute maxillary sinusitis, unspecified: Secondary | ICD-10-CM

## 2011-07-17 ENCOUNTER — Telehealth: Payer: Self-pay | Admitting: Family Medicine

## 2011-07-17 MED ORDER — AZITHROMYCIN 250 MG PO TABS: ORAL_TABLET | ORAL | Status: AC

## 2011-07-17 NOTE — Telephone Encounter (Signed)
Pt called and she says that she has a sinus infection and normally we call in a z-pack. Dr. Clent Ridges did approve and I called it in.

## 2011-07-26 ENCOUNTER — Ambulatory Visit (HOSPITAL_BASED_OUTPATIENT_CLINIC_OR_DEPARTMENT_OTHER)
Admission: RE | Admit: 2011-07-26 | Discharge: 2011-07-26 | Disposition: A | Discharge status: Home / Self Care | Payer: 59 | Source: Ambulatory Visit | Attending: Urology | Admitting: Urology

## 2011-07-26 DIAGNOSIS — N3010 Interstitial cystitis (chronic) without hematuria: Secondary | ICD-10-CM | POA: Insufficient documentation

## 2011-08-02 LAB — DIFFERENTIAL
Eosinophils Absolute: 0.1 — ABNORMAL LOW
Eosinophils Relative: 1
Lymphs Abs: 1.5

## 2011-08-02 LAB — CBC
HCT: 37.8
MCV: 90.9
Platelets: 213
RDW: 12.4
WBC: 6

## 2011-08-02 LAB — D-DIMER, QUANTITATIVE: D-Dimer, Quant: 0.28

## 2011-08-02 NOTE — Op Note (Signed)
  NAMEQUEENIE, AUFIERO NO.:  [id number]  MEDICAL RECORD NO.:  [id number]  LOCATION:                                 FACILITY:  PHYSICIAN:  Hosanna Betley I. Patsi Sears, M.D.DATE OF BIRTH:  11/19/1970  DATE OF PROCEDURE:  07/26/2011 DATE OF DISCHARGE:                              OPERATIVE REPORT   PREOPERATIVE DIAGNOSIS:  Interstitial cystitis.  POSTOPERATIVE DIAGNOSIS:  Interstitial cystitis.  OPERATION:  Cystourethroscopy, hydrodistention of the bladder (700 mL capacity), installation of Pyridium, Marcaine in the bladder, injection of Marcaine, Kenalog in the subtrigonal space.  SURGEON:  Marsheila Alejo I. Patsi Sears, M.D.  ANESTHESIA:  General LMA.  PREPARATION:  After appropriate preanesthesia, the patient was brought to the operating room and placed on the operating room in dorsal supine position where general LMA anesthesia was induced.  She was then replaced in dorsal lithotomy position where the pubis was prepped with Betadine solution and draped in the usual fashion.  BRIEF HISTORY:  This is a 40 year old white female with a history of ADD, currently engaged, with a longstanding history of interstitial cystitis beginning at age 35 with urinary frequency, urgency, pelvic pain and spasm, and dysuria.  She has been treated successfully with anticholinergics in the past, and took an __________.  However, she has recently had a major flare, requiring intravesical therapy, with Elmiron, and more recently, oral Paxil, which she believes has helped reduce her stress, as a trigger, and reduce her anxiety.  She is now for cysto __________.  PROCEDURE:  Cystourethroscopy was accomplished, and shows a normal- appearing BUS, a normal-appearing urethra.  The bladder base was normal with clear efflux from both orifices.  There was no bladder stone, tumor or diverticular formation.  Hydrodistention shows bladder capacity of 700 mL.  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 The patient then undergoes instillation of Pyridium, Marcaine in the bladder, injection of Marcaine, Kenalog in the subtrigonal space.  She had to be suppository placed.  She received IV Tylenol before the beginning of the case, and IV Toradol at the end of the procedure.  She was awakened, taken to the recovery room in good condition.     Trendon Zaring I. Patsi Sears, M.D.     SIT/MEDQ  D:  07/26/2011  T:  07/26/2011  Job:  [id number]  cc:   Jeannett Senior A. Clent Ridges, MD [address] [place] [address]  Electronically Signed by Jethro Bolus M.D. on 08/02/2011 12:52:18 PM

## 2011-08-15 ENCOUNTER — Other Ambulatory Visit: Payer: Self-pay | Admitting: Family Medicine

## 2011-08-15 NOTE — Telephone Encounter (Signed)
Pt need new rx generic valtrex 1000mg  #90 for cold sores and generic diflucan #30. Please call [facility]  phar [phone].

## 2011-08-16 MED ORDER — FLUCONAZOLE 150 MG PO TABS: 150.0000 mg | ORAL_TABLET | Freq: Once | ORAL | Status: AC

## 2011-08-16 MED ORDER — VALACYCLOVIR HCL 1 G PO TABS: 1000.0000 mg | ORAL_TABLET | Freq: Three times a day (TID) | ORAL | Status: DC

## 2011-08-16 NOTE — Telephone Encounter (Signed)
Script called in to [facility].

## 2011-08-16 NOTE — Telephone Encounter (Signed)
Please call in both of these with 3 rf each

## 2011-08-19 ENCOUNTER — Other Ambulatory Visit: Payer: Self-pay | Admitting: Family Medicine

## 2011-08-19 NOTE — Telephone Encounter (Signed)
Pt is requesting new rxs zomig #90 and hydrocodone #90. Pt needs med for migraines call into [facility] out pt phar [phone]

## 2011-08-19 NOTE — Telephone Encounter (Signed)
Call in #90 of each with no rf

## 2011-08-20 MED ORDER — ZOLMITRIPTAN 5 MG PO TABS: 5.0000 mg | ORAL_TABLET | ORAL | Status: DC | PRN

## 2011-08-20 MED ORDER — HYDROCODONE-ACETAMINOPHEN 5-500 MG PO TABS: 1.0000 | ORAL_TABLET | ORAL | Status: AC | PRN

## 2011-08-20 NOTE — Telephone Encounter (Signed)
Scripts called in to [facility] pharmacy and insurance will only cover # 27 of Zomig.

## 2011-08-30 NOTE — Progress Notes (Signed)
System Downtime Recovery The EMR experienced a system downtime.  This downtime occurred on 07-16-2011. During this downtime paper charting was completed by the provider.  The visit was documented on paper during the downtime and will be scanned into CHL/Epic, billing was completed by the [facility] .  The visit is being closed on behalf of the provider. 

## 2011-10-30 ENCOUNTER — Other Ambulatory Visit: Payer: Self-pay | Admitting: Family Medicine

## 2011-10-30 NOTE — Telephone Encounter (Signed)
Call in #60 with 5 rf 

## 2011-10-31 ENCOUNTER — Telehealth: Payer: Self-pay | Admitting: Family Medicine

## 2011-10-31 MED ORDER — AZITHROMYCIN 250 MG PO TABS: ORAL_TABLET | ORAL | Status: AC

## 2011-10-31 NOTE — Telephone Encounter (Signed)
Has her classic sinus infection and she [facility] in [place] ---[phone]. Thanks. Refused ov.

## 2011-10-31 NOTE — Telephone Encounter (Signed)
Call in a Zpack  ?

## 2011-10-31 NOTE — Telephone Encounter (Signed)
Rx sent to pharmacy   

## 2012-02-06 ENCOUNTER — Other Ambulatory Visit: Payer: Self-pay | Admitting: Family Medicine

## 2012-02-06 NOTE — Telephone Encounter (Signed)
Pt needs new rx lorazepam 1mg  #90 call into [facility]  [phone]

## 2012-02-07 NOTE — Telephone Encounter (Signed)
Call in #90 with 5 rf 

## 2012-02-10 MED ORDER — LORAZEPAM 1 MG PO TABS: 1.0000 mg | ORAL_TABLET | Freq: Three times a day (TID) | ORAL | Status: DC

## 2012-02-10 NOTE — Telephone Encounter (Signed)
Addended by: Aniceto Boss A on: 02/10/2012 05:23 PM   Modules accepted: Orders

## 2012-02-10 NOTE — Telephone Encounter (Signed)
Script called in

## 2012-04-09 ENCOUNTER — Telehealth: Payer: Self-pay | Admitting: Family Medicine

## 2012-04-09 NOTE — Telephone Encounter (Signed)
Refill request for Zolpidem Tartrate 10 mg take 1 po qhs prn and pt last here on 07/16/11.

## 2012-04-10 MED ORDER — ZOLPIDEM TARTRATE 10 MG PO TABS: 10.0000 mg | ORAL_TABLET | Freq: Every evening | ORAL | Status: DC | PRN

## 2012-04-10 NOTE — Telephone Encounter (Signed)
Call in a 6 month supply  

## 2012-04-10 NOTE — Telephone Encounter (Signed)
I called in script 

## 2012-05-12 ENCOUNTER — Other Ambulatory Visit: Payer: Self-pay | Admitting: Family Medicine

## 2012-05-12 MED ORDER — ZOLPIDEM TARTRATE 10 MG PO TABS: 10.0000 mg | ORAL_TABLET | Freq: Every evening | ORAL | Status: DC | PRN

## 2012-05-12 NOTE — Telephone Encounter (Signed)
Pt said she take generic ambien 2 pills a bedtime #[facility] [phone]

## 2012-05-12 NOTE — Telephone Encounter (Signed)
We gave her a 6 month supply last month

## 2012-05-12 NOTE — Telephone Encounter (Signed)
I had to call in script again and left voice message for pt.

## 2012-05-15 ENCOUNTER — Other Ambulatory Visit: Payer: Self-pay | Admitting: Family Medicine

## 2012-05-15 MED ORDER — ZOLPIDEM TARTRATE 10 MG PO TABS: 20.0000 mg | ORAL_TABLET | Freq: Every evening | ORAL | Status: DC | PRN

## 2012-05-15 NOTE — Telephone Encounter (Signed)
I called in script, okay per Dr. Clent Ridges.

## 2012-08-17 ENCOUNTER — Emergency Department: Payer: Self-pay | Admitting: Emergency Medicine

## 2012-08-18 ENCOUNTER — Emergency Department: Payer: Self-pay | Admitting: Emergency Medicine

## 2012-08-30 ENCOUNTER — Other Ambulatory Visit: Payer: Self-pay | Admitting: Family Medicine

## 2012-08-31 NOTE — Telephone Encounter (Signed)
Call in #90 with no rf. She needs an OV soon  

## 2012-09-25 ENCOUNTER — Other Ambulatory Visit: Payer: Self-pay | Admitting: Family Medicine

## 2012-09-28 NOTE — Telephone Encounter (Signed)
Call in #90 only. She needs and OV for more

## 2012-10-27 ENCOUNTER — Other Ambulatory Visit: Payer: Self-pay | Admitting: Family Medicine

## 2012-10-30 NOTE — Telephone Encounter (Signed)
Call in Zolpidem #60 and Lorazepam #90 with NO REFILLS. She needs an OV

## 2012-11-01 ENCOUNTER — Other Ambulatory Visit: Payer: Self-pay | Admitting: Family Medicine

## 2012-11-02 ENCOUNTER — Telehealth: Payer: Self-pay | Admitting: Family Medicine

## 2012-11-02 ENCOUNTER — Other Ambulatory Visit: Payer: Self-pay | Admitting: Family Medicine

## 2012-11-02 MED ORDER — LORAZEPAM 1 MG PO TABS: 1.0000 mg | ORAL_TABLET | Freq: Three times a day (TID) | ORAL | Status: DC | PRN

## 2012-11-02 NOTE — Telephone Encounter (Signed)
[facility] and ambien. Please send in as pt is out of medication.  Appt scheduled for 11/26/12 per instructions.

## 2012-11-02 NOTE — Telephone Encounter (Signed)
I called in both script and pt does have a office visit scheduled for 10/2012.

## 2012-11-26 ENCOUNTER — Encounter: Payer: Self-pay | Admitting: Family Medicine

## 2012-11-26 ENCOUNTER — Ambulatory Visit (INDEPENDENT_AMBULATORY_CARE_PROVIDER_SITE_OTHER): Payer: BC Managed Care – PPO | Admitting: Family Medicine

## 2012-11-26 VITALS — BP 132/86 | HR 97 | Temp 97.7°F | Wt 127.0 lb

## 2012-11-26 DIAGNOSIS — G43909 Migraine, unspecified, not intractable, without status migrainosus: Secondary | ICD-10-CM

## 2012-11-26 DIAGNOSIS — F411 Generalized anxiety disorder: Secondary | ICD-10-CM

## 2012-11-26 DIAGNOSIS — G4700 Insomnia, unspecified: Secondary | ICD-10-CM

## 2012-11-26 DIAGNOSIS — G43109 Migraine with aura, not intractable, without status migrainosus: Secondary | ICD-10-CM

## 2012-11-26 MED ORDER — HYDROCODONE-ACETAMINOPHEN 5-325 MG PO TABS: 1.0000 | ORAL_TABLET | Freq: Four times a day (QID) | ORAL | Status: DC | PRN

## 2012-11-26 MED ORDER — LORAZEPAM 1 MG PO TABS: 1.0000 mg | ORAL_TABLET | Freq: Three times a day (TID) | ORAL | Status: DC | PRN

## 2012-11-26 MED ORDER — ZOLPIDEM TARTRATE 10 MG PO TABS: 20.0000 mg | ORAL_TABLET | Freq: Every evening | ORAL | Status: DC | PRN

## 2012-11-26 MED ORDER — ESCITALOPRAM OXALATE 5 MG PO TABS: 5.0000 mg | ORAL_TABLET | Freq: Every day | ORAL | Status: DC
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
## 2013-05-15 ENCOUNTER — Other Ambulatory Visit: Payer: Self-pay | Admitting: Family Medicine

## 2013-05-18 NOTE — Telephone Encounter (Signed)
Per Dr. Clent Ridges change the Ambien to # 60, which I did phone in both scripts.

## 2013-05-18 NOTE — Telephone Encounter (Signed)
Also Zolpidem #30 with 5 rf

## 2013-05-18 NOTE — Telephone Encounter (Signed)
Call in Ativan #90 with 5 rf, also

## 2013-09-08 ENCOUNTER — Ambulatory Visit: Payer: Self-pay | Admitting: Obstetrics and Gynecology

## 2013-11-16 ENCOUNTER — Other Ambulatory Visit: Payer: Self-pay | Admitting: Family Medicine

## 2013-11-18 NOTE — Telephone Encounter (Signed)
Call in #60 with 2 rf. She will need an OV after that

## 2013-12-14 ENCOUNTER — Other Ambulatory Visit: Payer: Self-pay | Admitting: Family Medicine

## 2013-12-15 NOTE — Telephone Encounter (Signed)
Call in #90 only, she needs an OV

## 2014-01-12 ENCOUNTER — Other Ambulatory Visit: Payer: Self-pay | Admitting: Family Medicine

## 2014-01-14 NOTE — Telephone Encounter (Signed)
Call in #90 only. She needs an OV for any more

## 2014-02-22 ENCOUNTER — Encounter: Payer: Self-pay | Admitting: Family Medicine

## 2014-02-22 ENCOUNTER — Ambulatory Visit (INDEPENDENT_AMBULATORY_CARE_PROVIDER_SITE_OTHER): Payer: BC Managed Care – PPO | Admitting: Family Medicine

## 2014-02-22 VITALS — BP 115/83 | HR 78 | Temp 98.1°F | Ht 67.0 in | Wt 126.0 lb

## 2014-02-22 DIAGNOSIS — F411 Generalized anxiety disorder: Secondary | ICD-10-CM

## 2014-02-22 DIAGNOSIS — G4700 Insomnia, unspecified: Secondary | ICD-10-CM

## 2014-02-22 DIAGNOSIS — M546 Pain in thoracic spine: Secondary | ICD-10-CM

## 2014-02-22 DIAGNOSIS — F909 Attention-deficit hyperactivity disorder, unspecified type: Secondary | ICD-10-CM

## 2014-02-22 MED ORDER — ZOLPIDEM TARTRATE 10 MG PO TABS: ORAL_TABLET | ORAL | Status: DC

## 2014-02-22 MED ORDER — HYDROCODONE-ACETAMINOPHEN 5-325 MG PO TABS: 1.0000 | ORAL_TABLET | Freq: Four times a day (QID) | ORAL | Status: DC | PRN

## 2014-02-22 MED ORDER — ZOLMITRIPTAN 5 MG PO TABS
5.0000 mg | ORAL_TABLET | ORAL | Status: DC | PRN
Start: 2014-02-22 — End: 2014-11-23

## 2014-02-22 MED ORDER — MELOXICAM 15 MG PO TABS: 15.0000 mg | ORAL_TABLET | Freq: Every day | ORAL | Status: DC

## 2014-02-22 MED ORDER — CYCLOBENZAPRINE HCL 10 MG PO TABS: 10.0000 mg | ORAL_TABLET | Freq: Three times a day (TID) | ORAL | Status: DC | PRN

## 2014-02-22 MED ORDER — LORAZEPAM 1 MG PO TABS: ORAL_TABLET | ORAL | Status: DC

## 2014-02-22 NOTE — Progress Notes (Signed)
   Subjective:    Patient ID: Martha [name], female    DOB: [birth year], 43 y.o.   MRN: [id number]  HPI Here for refills. She is doing well in general. Her migraines are well controlled. She has stopped Lexapro. She is using Strattera for her ADHD which is supplied by a doctor she works with, and she is pleased with this.    Review of Systems  Constitutional: Negative.   Respiratory: Negative.   Cardiovascular: Negative.   Neurological: Negative.        Objective:   Physical Exam  Constitutional: She is oriented to person, place, and time. She appears well-developed and well-nourished.  Cardiovascular: Normal rate, regular rhythm, normal heart sounds and intact distal pulses.   Pulmonary/Chest: Effort normal and breath sounds normal.  Neurological: She is alert and oriented to person, place, and time.          Assessment & Plan:  Her meds were refilled.

## 2014-02-22 NOTE — Progress Notes (Signed)
Pre visit review using our clinic review tool, if applicable. No additional management support is needed unless otherwise documented below in the visit note. 

## 2014-09-15 ENCOUNTER — Other Ambulatory Visit: Payer: Self-pay | Admitting: Family Medicine

## 2014-09-16 NOTE — Telephone Encounter (Signed)
Call in #90 with 5 rf 

## 2014-09-21 ENCOUNTER — Telehealth: Payer: Self-pay | Admitting: Family Medicine

## 2014-09-21 MED ORDER — ZOLPIDEM TARTRATE 10 MG PO TABS: ORAL_TABLET | ORAL | Status: DC

## 2014-09-21 NOTE — Telephone Encounter (Signed)
I called in script 

## 2014-09-21 NOTE — Telephone Encounter (Signed)
Call in #60 with 5 rf 

## 2014-09-21 NOTE — Telephone Encounter (Signed)
[id number] [place] [place] - [address] [phone] is requesting re-fill on zolpidem (AMBIEN) 10 MG tablet

## 2014-11-22 ENCOUNTER — Telehealth: Payer: Self-pay | Admitting: Family Medicine

## 2014-11-22 NOTE — Telephone Encounter (Signed)
Pt has been sch

## 2014-11-22 NOTE — Telephone Encounter (Signed)
Lm on vm tp cb

## 2014-11-22 NOTE — Telephone Encounter (Signed)
Pt has new pharm and they are requesting new rx zolpidem (AMBIEN) 10 MG tablet . [facility] [address] [address]  Pt states she would also like to start escitalopram (LEXAPRO) 10 MG tablet . (Pt was on 20 mg and that was too much.)  She would like to start taking Vyvanse as well for add.

## 2014-11-22 NOTE — Telephone Encounter (Signed)
Pt will need to schedule a office visit to discuss these request.

## 2014-11-23 ENCOUNTER — Ambulatory Visit (INDEPENDENT_AMBULATORY_CARE_PROVIDER_SITE_OTHER): Payer: BLUE CROSS/BLUE SHIELD | Admitting: Family Medicine

## 2014-11-23 ENCOUNTER — Encounter: Payer: Self-pay | Admitting: Family Medicine

## 2014-11-23 VITALS — BP 149/79 | HR 77 | Temp 98.4°F | Ht 67.0 in | Wt 130.0 lb

## 2014-11-23 DIAGNOSIS — G43809 Other migraine, not intractable, without status migrainosus: Secondary | ICD-10-CM

## 2014-11-23 DIAGNOSIS — F411 Generalized anxiety disorder: Secondary | ICD-10-CM

## 2014-11-23 DIAGNOSIS — F900 Attention-deficit hyperactivity disorder, predominantly inattentive type: Secondary | ICD-10-CM

## 2014-11-23 DIAGNOSIS — G4700 Insomnia, unspecified: Secondary | ICD-10-CM

## 2014-11-23 DIAGNOSIS — G43909 Migraine, unspecified, not intractable, without status migrainosus: Secondary | ICD-10-CM | POA: Insufficient documentation

## 2014-11-23 DIAGNOSIS — R350 Frequency of micturition: Secondary | ICD-10-CM

## 2014-11-23 LAB — POCT URINALYSIS DIPSTICK
Bilirubin, UA: NEGATIVE
Blood, UA: NEGATIVE
Glucose, UA: NEGATIVE
Ketones, UA: NEGATIVE
LEUKOCYTES UA: NEGATIVE
Nitrite, UA: NEGATIVE
PH UA: 7
Protein, UA: NEGATIVE
Spec Grav, UA: 1.02
Urobilinogen, UA: 0.2

## 2014-11-23 LAB — POCT URINE PREGNANCY: Preg Test, Ur: NEGATIVE

## 2014-11-23 MED ORDER — ZOLPIDEM TARTRATE 10 MG PO TABS: ORAL_TABLET | ORAL | Status: DC

## 2014-11-23 MED ORDER — ESCITALOPRAM OXALATE 10 MG PO TABS: 10.0000 mg | ORAL_TABLET | Freq: Every day | ORAL | Status: DC

## 2014-11-23 MED ORDER — CYCLOBENZAPRINE HCL 10 MG PO TABS: 10.0000 mg | ORAL_TABLET | Freq: Three times a day (TID) | ORAL | Status: DC | PRN

## 2014-11-23 MED ORDER — ZOLMITRIPTAN 5 MG PO TABS: 5.0000 mg | ORAL_TABLET | ORAL | Status: DC | PRN

## 2014-11-23 MED ORDER — LISDEXAMFETAMINE DIMESYLATE 30 MG PO CAPS: 30.0000 mg | ORAL_CAPSULE | Freq: Every day | ORAL | Status: DC

## 2014-11-23 MED ORDER — MELOXICAM 15 MG PO TABS: 15.0000 mg | ORAL_TABLET | Freq: Every day | ORAL | Status: DC

## 2014-11-23 NOTE — Progress Notes (Signed)
Pre visit review using our clinic review tool, if applicable. No additional management support is needed unless otherwise documented below in the visit note. 

## 2014-11-23 NOTE — Progress Notes (Signed)
   Subjective:    Patient ID: Martha [name], female    DOB: 29-Jul-1971, 44 y.o.   MRN: [id number]  HPI Here to follow up on some issues. Her migraines are under much better control but she needs refills on Zomig. Her anxiety has been more of a problem lately, especially on the job. She [place] working with [place] and her job is very high stress. She would like to get back on Lexapro. Also her ADHD has been more of a problem lately and it is affecting her job. She stopped taking Strattera because it never helped her. She had good results with Adderall but it interfered with her sleep. She is interested in trying Vyvanse because her son takes this and does well with it. She sleeps well with Zolpidem.   Review of Systems  Constitutional: Negative.   Respiratory: Negative.   Cardiovascular: Negative.   Neurological: Negative.   Psychiatric/Behavioral: Positive for decreased concentration. Negative for behavioral problems, confusion, dysphoric mood and agitation. The patient is nervous/anxious.        Objective:   Physical Exam  Constitutional: She is oriented to person, place, and time. She appears well-developed and well-nourished.  Cardiovascular: Normal rate, regular rhythm, normal heart sounds and intact distal pulses.   Pulmonary/Chest: Effort normal and breath sounds normal.  Neurological: She is alert and oriented to person, place, and time.  Psychiatric: She has a normal mood and affect. Her behavior is normal. Thought content normal.          Assessment & Plan:  Start on Lexapro 10 mg daily and Vyvanse 30 mg daily. Refilled other meds. Recheck in one month

## 2015-03-10 ENCOUNTER — Other Ambulatory Visit: Payer: Self-pay | Admitting: Family Medicine

## 2015-03-10 NOTE — Telephone Encounter (Signed)
Pt states she gets chronic sinus inf and dr fry will call in rx for her. Pt request zpak and a round of prednisone.  [facility] [address] [address]  (this will be only pharm from now on)

## 2015-03-13 MED ORDER — AZITHROMYCIN 250 MG PO TABS: ORAL_TABLET | ORAL | Status: DC

## 2015-03-13 MED ORDER — METHYLPREDNISOLONE 4 MG PO TBPK: ORAL_TABLET | ORAL | Status: DC

## 2015-03-13 NOTE — Telephone Encounter (Signed)
Call in a Zpack and a Medrol dose pack

## 2015-03-13 NOTE — Telephone Encounter (Signed)
[facility] [place], pt aware

## 2015-05-03 ENCOUNTER — Other Ambulatory Visit: Payer: Self-pay | Admitting: Family Medicine

## 2015-05-03 NOTE — Telephone Encounter (Signed)
Call in #90 with 5 rf 

## 2015-05-24 ENCOUNTER — Other Ambulatory Visit: Payer: Self-pay | Admitting: Family Medicine

## 2015-05-25 NOTE — Telephone Encounter (Signed)
Call in #60 with 5 rf 

## 2015-10-25 ENCOUNTER — Telehealth: Payer: Self-pay | Admitting: *Deleted

## 2015-10-25 ENCOUNTER — Encounter: Payer: Self-pay | Admitting: Family Medicine

## 2015-10-25 ENCOUNTER — Ambulatory Visit (INDEPENDENT_AMBULATORY_CARE_PROVIDER_SITE_OTHER): Payer: BLUE CROSS/BLUE SHIELD | Admitting: Family Medicine

## 2015-10-25 VITALS — BP 117/76 | HR 71 | Temp 98.8°F | Ht 67.0 in | Wt 139.0 lb

## 2015-10-25 DIAGNOSIS — F411 Generalized anxiety disorder: Secondary | ICD-10-CM | POA: Diagnosis not present

## 2015-10-25 DIAGNOSIS — F32A Depression, unspecified: Secondary | ICD-10-CM

## 2015-10-25 DIAGNOSIS — F900 Attention-deficit hyperactivity disorder, predominantly inattentive type: Secondary | ICD-10-CM

## 2015-10-25 DIAGNOSIS — F329 Major depressive disorder, single episode, unspecified: Secondary | ICD-10-CM

## 2015-10-25 DIAGNOSIS — J019 Acute sinusitis, unspecified: Secondary | ICD-10-CM | POA: Diagnosis not present

## 2015-10-25 MED ORDER — BUPROPION HCL ER (XL) 150 MG PO TB24: 150.0000 mg | ORAL_TABLET | Freq: Every day | ORAL | Status: DC

## 2015-10-25 MED ORDER — AZITHROMYCIN 250 MG PO TABS: ORAL_TABLET | ORAL | Status: DC

## 2015-10-25 MED ORDER — VALACYCLOVIR HCL 1 G PO TABS: 1000.0000 mg | ORAL_TABLET | Freq: Three times a day (TID) | ORAL | Status: DC | PRN

## 2015-10-25 NOTE — Progress Notes (Signed)
   Subjective:    Patient ID: Martha [name] Inge, female    DOB: 06-17-1971, 44 y.o.   MRN: [id number]  HPI Here to discuss a possible sinus infection and to talk about her anxiety. For the past week she has had sinus pressure, PND, ST , and a dry cough. Taking Mucinex and drinking fluids. Also she ask to try something new for her anxiety. She has a hx of depression and anxiety, but lately the depression has not been much of a problem. Her anxiety is worrisome however and it often affects her work. She could not tolerate either Celexa or Lexapro due to the side effect of weight gain. She has stopped taking Vyvanse since her ADHD has not been much of an issue the past 6 months.    Review of Systems  Constitutional: Negative.   HENT: Positive for congestion, ear pain, postnasal drip, sinus pressure and sore throat.   Eyes: Negative.   Respiratory: Positive for cough.   Neurological: Negative.   Psychiatric/Behavioral: Positive for decreased concentration. Negative for behavioral problems, confusion, dysphoric mood and agitation. The patient is nervous/anxious.        Objective:   Physical Exam  Constitutional: She is oriented to person, place, and time. She appears well-developed and well-nourished.  HENT:  Right Ear: External ear normal.  Left Ear: External ear normal.  Nose: Nose normal.  Mouth/Throat: Oropharynx is clear and moist.  Eyes: Conjunctivae are normal.  Neck: No thyromegaly present.  Pulmonary/Chest: Effort normal and breath sounds normal.  Lymphadenopathy:    She has no cervical adenopathy.  Neurological: She is alert and oriented to person, place, and time.  Psychiatric: She has a normal mood and affect. Her behavior is normal. Thought content normal.          Assessment & Plan:  For the sinusitis, treat with a Zpack. For the anxiety, try Wellbutrin XL 150 mg daily. I reassured her that weight gain is a very unlikely side effect of this. Her ADHD seems to be stable  now, but the Wellbutrin could have some benefits for this as well.

## 2015-10-25 NOTE — Telephone Encounter (Signed)
Pt scheduled to see  Dr. Clent RidgesFry 10/25/2015.    PLEASE NOTE: All timestamps contained within this report are represented as [place] Standard Time. CONFIDENTIALTY NOTICE: This fax transmission is intended only for the addressee. It contains information that is legally privileged, confidential or otherwise protected from use or disclosure. If you are not the intended recipient, you are strictly prohibited from reviewing, disclosing, copying using or disseminating any of this information or taking any action in reliance on or regarding this information. If you have received this fax in error, please notify us immediately by telephone so that we can arrange for its return to us. Phone: [phone], Toll-Free: [phone], Fax: [phone] Page: 1 of 2 Call Id: [id number] [facility] - Client TELEPHONE ADVICE RECORD [facility] Patient Name: Martha [name] Gender: Female DOB: 03/13/1971 Age: 89 Y 8 M 12 D Return Phone Number: [phone] (Primary) Address: City/State/Zip: [place] [id number] Client [facility] - Client Client Site [facility] - Night Physician Gershon CraneFry, Stephen Contact Type Call Call Type Triage / Clinical Relationship To Patient Self Return Phone Number [phone] (Primary) Chief Complaint Mouth Symptoms Initial Comment Caller states she has fever blisters on her mouth - burning and painful - going out of town in a few hours, may have to use different pharamcy if script needs to be called in PreDisposition Call Doctor Nurse Assessment Nurse: Scarlette ArStandifer, RN, Herbert SetaHeather Date/Time (Eastern Time): 10/21/2015 2:32:41 PM Confirm and document reason for call. If symptomatic, describe symptoms. ---Caller states she has fever blisters on her mouth - burning and painful - going out of town in a few hours, may have to use different pharmacy if script needs to be called in Has the patient traveled out  of the country within the last 30 days? ---Not Applicable Does the patient have any new or worsening symptoms? ---Yes Will a triage be completed? ---Yes Related visit to physician within the last 2 weeks? ---No Does the PT have any chronic conditions? (i.e. diabetes, asthma, etc.) ---No Did the patient indicate they were pregnant? ---No Is this a behavioral health or substance abuse call? 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 Time Lamount Cohen(Eastern Time) Disposition Final User 10/21/2015 1:38:32 PM Attempt made - message left Standifer, RN, Herbert SetaHeather 10/21/2015 2:42:29 PM Call Completed Standifer, RN, Heather PLEASE NOTE: All timestamps contained within this report are represented as [place] Standard Time. CONFIDENTIALTY NOTICE: This fax transmission is intended only for the addressee. It contains information that is legally privileged, confidential or otherwise protected from use or disclosure. If you are not the intended recipient, you are strictly prohibited from reviewing, disclosing, copying using or disseminating any of this information or taking any action in reliance on or regarding this information. If you have received this fax in error, please notify us immediately by telephone so that we can arrange for its return to us. Phone: [phone], Toll-Free: [phone], Fax: [phone] Page: 2 of 2 Call Id: [id number] 10/21/2015 2:42:05 PM See PCP within 2 Weeks Yes Standifer, RN, Sibyl ParrHeather Caller Understands: Yes Disagree/Comply: Comply Care Advice Given Per Guideline SEE PCP WITHIN 2 WEEKS: You need an evaluation for this ongoing problem within the next 2 weeks. Call your doctor during regular office hours and make an  appointment.  CARE ADVICE given per Fever Blisters of Lip (Cold Sores) (Adult) guideline. After Care Instructions Given Call Event Type User Date / Time Description

## 2015-10-25 NOTE — Progress Notes (Signed)
Pre visit review using our clinic review tool, if applicable. No additional management support is needed unless otherwise documented below in the visit note. 

## 2015-10-27 ENCOUNTER — Telehealth: Payer: Self-pay | Admitting: Family Medicine

## 2015-10-27 NOTE — Telephone Encounter (Signed)
Ms. Sharyne Peachnge called saying she needs a note faxed to her job saying she had an appt yesterday and came. Please fax the note to [employer]. Also, call the pt if needed.   [employer] fax# [phone] Attn: Berton LanMark Shubring  Pt's ph# [phone] Thank you.

## 2015-10-27 NOTE — Telephone Encounter (Signed)
Letter faxed. Thanks

## 2015-10-29 HISTORY — PX: MEDIAL COLLATERAL LIGAMENT REPAIR, KNEE: SHX2019

## 2015-11-28 ENCOUNTER — Other Ambulatory Visit: Payer: Self-pay | Admitting: Family Medicine

## 2015-11-29 ENCOUNTER — Encounter: Payer: Self-pay | Admitting: Family Medicine

## 2015-11-29 ENCOUNTER — Ambulatory Visit (INDEPENDENT_AMBULATORY_CARE_PROVIDER_SITE_OTHER): Payer: BLUE CROSS/BLUE SHIELD | Admitting: Family Medicine

## 2015-11-29 VITALS — BP 120/74 | HR 73 | Temp 98.4°F | Ht 67.0 in | Wt 135.0 lb

## 2015-11-29 DIAGNOSIS — M26609 Unspecified temporomandibular joint disorder, unspecified side: Secondary | ICD-10-CM | POA: Diagnosis not present

## 2015-11-29 DIAGNOSIS — M26659 Arthropathy of unspecified temporomandibular joint: Secondary | ICD-10-CM

## 2015-11-29 DIAGNOSIS — H8110 Benign paroxysmal vertigo, unspecified ear: Secondary | ICD-10-CM

## 2015-11-29 MED ORDER — MECLIZINE HCL 25 MG PO TABS
25.0000 mg | ORAL_TABLET | ORAL | Status: DC | PRN
Start: 2015-11-29 — End: 2016-03-26

## 2015-11-29 MED ORDER — MELOXICAM 15 MG PO TABS: 15.0000 mg | ORAL_TABLET | Freq: Every day | ORAL | Status: DC

## 2015-11-29 NOTE — Telephone Encounter (Signed)
Call in #90 with 5 rf 

## 2015-11-29 NOTE — Progress Notes (Signed)
Pre visit review using our clinic review tool, if applicable. No additional management support is needed unless otherwise documented below in the visit note. 

## 2015-11-29 NOTE — Progress Notes (Signed)
   Subjective:    Patient ID: Martha [name], female    DOB: 05-Sep-1971, 45 y.o.   MRN: [id number]  HPI Here for several things. First about 2 weeks ago she developed some mild intermittent dizziness which she describes as feeling like things are spinning around her. Sometimes this makes her nauseated but she has not vomited. No headache or sinus pressure. We treated her for a sinus infection about 6 weeks ago, but this seems to have totally resolved. No hearing changes or tinnitus. She also reports one week of pain in the right ear. Sometimes chewing can be painful. She used to take Meloxicam daily until she stopped this about one month ago.    Review of Systems  Constitutional: Negative.   HENT: Positive for ear pain. Negative for congestion, ear discharge, hearing loss, postnasal drip, sinus pressure, sore throat and tinnitus.   Eyes: Negative.   Respiratory: Negative.   Cardiovascular: Negative.   Neurological: Positive for dizziness. Negative for tremors, seizures, syncope, facial asymmetry, speech difficulty, weakness, light-headedness, numbness and headaches.       Objective:   Physical Exam  Constitutional: She is oriented to person, place, and time. She appears well-developed and well-nourished. No distress.  HENT:  Right Ear: External ear normal.  Left Ear: External ear normal.  Nose: Nose normal.  Mouth/Throat: Oropharynx is clear and moist.  Very tender over the right TMJ with crepitus   Eyes: Conjunctivae and EOM are normal. Pupils are equal, round, and reactive to light.  Neck: Neck supple. No thyromegaly present.  Cardiovascular: Normal rate, regular rhythm, normal heart sounds and intact distal pulses.   Pulmonary/Chest: Effort normal and breath sounds normal.  Lymphadenopathy:    She has no cervical adenopathy.  Neurological: She is alert and oriented to person, place, and time. No cranial nerve deficit. She exhibits normal muscle tone. Coordination normal.           Assessment & Plan:  She has TMJ inflammation, so she will get back on Meloxicam 15 mg every day. She will ask her dentist about fashioning a mouthguard to wear at night. For the vertigo, drink fluids and start on Zyrtec 10 mg BID. Use Meclizine 25 mg prn

## 2015-12-05 ENCOUNTER — Ambulatory Visit: Payer: BLUE CROSS/BLUE SHIELD | Admitting: Family Medicine

## 2015-12-05 ENCOUNTER — Telehealth: Payer: Self-pay | Admitting: Family Medicine

## 2015-12-05 MED ORDER — METHYLPREDNISOLONE 4 MG PO TBPK: ORAL_TABLET | ORAL | Status: DC

## 2015-12-05 NOTE — Telephone Encounter (Signed)
Pt is still having dizziness and would like to increase meclizine. Pt also would like to know if there anything else she can do

## 2015-12-05 NOTE — Telephone Encounter (Signed)
She is already on the maximum dose of meclizine. Lets try a steroid taper. Call in a Medrol dose pack.

## 2015-12-05 NOTE — Telephone Encounter (Signed)
I sent script e-scribe and spoke with pt. 

## 2015-12-11 ENCOUNTER — Telehealth: Payer: Self-pay | Admitting: Family Medicine

## 2015-12-11 MED ORDER — AZITHROMYCIN 250 MG PO TABS: ORAL_TABLET | ORAL | Status: DC

## 2015-12-11 NOTE — Telephone Encounter (Signed)
Call in a Zpack  ?

## 2015-12-11 NOTE — Telephone Encounter (Signed)
Pt call to say she has a sinus infection and is asking if Dr Clent Ridges will call her in a zpak   [facility] [place]

## 2015-12-11 NOTE — Telephone Encounter (Signed)
I sent script e-scribe and left a voice message for pt with this information.  

## 2016-01-08 ENCOUNTER — Other Ambulatory Visit: Payer: Self-pay | Admitting: Family Medicine

## 2016-01-08 ENCOUNTER — Telehealth: Payer: Self-pay | Admitting: Family Medicine

## 2016-01-08 MED ORDER — ZOLPIDEM TARTRATE 10 MG PO TABS: ORAL_TABLET | ORAL | Status: DC

## 2016-01-08 NOTE — Telephone Encounter (Signed)
I called in script 

## 2016-01-08 NOTE — Telephone Encounter (Signed)
[facility] [place] [facility] her that [facility] sent a refill request to Thursday and she is not out of medication.

## 2016-01-08 NOTE — Telephone Encounter (Signed)
Call in #60 with 5 rf 

## 2016-03-13 ENCOUNTER — Telehealth: Payer: Self-pay | Admitting: Family Medicine

## 2016-03-13 MED ORDER — AZITHROMYCIN 250 MG PO TABS: ORAL_TABLET | ORAL | Status: DC

## 2016-03-13 NOTE — Telephone Encounter (Signed)
I sent script e-scribe and left a voice message for pt with this information.  

## 2016-03-13 NOTE — Telephone Encounter (Signed)
Pt has a sinus infection and per pt md normally l call in [facility]pak [facility] [place] [address]

## 2016-03-13 NOTE — Telephone Encounter (Signed)
Call in a Zpack  ?

## 2016-03-26 ENCOUNTER — Other Ambulatory Visit: Payer: Self-pay | Admitting: Family Medicine

## 2016-03-26 ENCOUNTER — Encounter: Payer: Self-pay | Admitting: Family Medicine

## 2016-03-26 ENCOUNTER — Encounter: Payer: BLUE CROSS/BLUE SHIELD | Admitting: Family Medicine

## 2016-03-26 ENCOUNTER — Ambulatory Visit (INDEPENDENT_AMBULATORY_CARE_PROVIDER_SITE_OTHER): Payer: BLUE CROSS/BLUE SHIELD | Admitting: Family Medicine

## 2016-03-26 VITALS — BP 100/66 | HR 69 | Temp 98.5°F | Ht 67.0 in | Wt 142.0 lb

## 2016-03-26 DIAGNOSIS — Z0000 Encounter for general adult medical examination without abnormal findings: Secondary | ICD-10-CM | POA: Diagnosis not present

## 2016-03-26 MED ORDER — METHYLPHENIDATE HCL ER (LA) 20 MG PO CP24: 20.0000 mg | ORAL_CAPSULE | ORAL | Status: DC

## 2016-03-26 MED ORDER — ZOLMITRIPTAN 5 MG PO TABS: 5.0000 mg | ORAL_TABLET | ORAL | Status: DC | PRN

## 2016-03-26 NOTE — Progress Notes (Signed)
   Subjective:    Patient ID: Martha KittenNatalie J [name], female    DOB: 1971-09-26, 45 y.o.   MRN: [id number]  HPI 45 yr old female for a wellness exam. She sees her GYN regularly. She has had heavy menstrual bleeding and they are considering whether to do an endometrial ablation or not. She complains today of constant fatigue which started about 6 months ago. She sleeps well but admits to not getting enough sleep. She has not been exercising in awhile. She also asks for advice about her mild anxiety and her ADHD symptoms. She has tried several Adderall type medications, including Vyvanse, with mixed success. She tried Strattera but stopped it due to nausea. I do not believe she has tried anything in the Ritalin family. She stopped taking Wellbutrin some months ago because it made her jittery and did not really help. She believes most of her anxiety results from the ADHD making it hard to get things done. Her headaches have been stable.    Review of Systems  Constitutional: Positive for fatigue. Negative for fever, chills, diaphoresis, activity change, appetite change and unexpected weight change.  HENT: Negative.   Eyes: Negative.   Respiratory: Negative.   Cardiovascular: Negative.   Gastrointestinal: Negative.   Genitourinary: Negative for dysuria, urgency, frequency, hematuria, flank pain, decreased urine volume, enuresis, difficulty urinating, pelvic pain and dyspareunia.  Musculoskeletal: Negative.   Skin: Negative.   Neurological: Negative.   Psychiatric/Behavioral: Positive for decreased concentration. Negative for suicidal ideas, hallucinations, behavioral problems, confusion, sleep disturbance, self-injury, dysphoric mood and agitation. The patient is nervous/anxious. The patient is not hyperactive.        Objective:   Physical Exam  Constitutional: She is oriented to person, place, and time. She appears well-developed and well-nourished. No distress.  HENT:  Head: Normocephalic and  atraumatic.  Right Ear: External ear normal.  Left Ear: External ear normal.  Nose: Nose normal.  Mouth/Throat: Oropharynx is clear and moist. No oropharyngeal exudate.  Eyes: Conjunctivae and EOM are normal. Pupils are equal, round, and reactive to light. No scleral icterus.  Neck: Normal range of motion. Neck supple. No JVD present. No thyromegaly present.  Cardiovascular: Normal rate, regular rhythm, normal heart sounds and intact distal pulses.  Exam reveals no gallop and no friction rub.   No murmur heard. Pulmonary/Chest: Effort normal and breath sounds normal. No respiratory distress. She has no wheezes. She has no rales. She exhibits no tenderness.  Abdominal: Soft. Bowel sounds are normal. She exhibits no distension and no mass. There is no tenderness. There is no rebound and no guarding.  Musculoskeletal: Normal range of motion. She exhibits no edema or tenderness.  Lymphadenopathy:    She has no cervical adenopathy.  Neurological: She is alert and oriented to person, place, and time. She has normal reflexes. No cranial nerve deficit. She exhibits normal muscle tone. Coordination normal.  Skin: Skin is warm and dry. No rash noted. No erythema.  Psychiatric: She has a normal mood and affect. Her behavior is normal. Judgment and thought content normal.          Assessment & Plan:  Well exam. We discussed diet and I encouraged her to get back into regular exercise. She will try Ritalin LA daily and let us know how this is working. She will arrange for fasting labs soon. Her fatigue could be due to anemia, among other things.  Nelwyn SalisburyFRY,STEPHEN A, MD

## 2016-03-26 NOTE — Progress Notes (Signed)
Pre visit review using our clinic review tool, if applicable. No additional management support is needed unless otherwise documented below in the visit note. 

## 2016-03-29 ENCOUNTER — Telehealth: Payer: Self-pay | Admitting: Family Medicine

## 2016-03-29 MED ORDER — FLUCONAZOLE 150 MG PO TABS: 150.0000 mg | ORAL_TABLET | Freq: Once | ORAL | Status: DC

## 2016-03-29 NOTE — Telephone Encounter (Signed)
Call in Diflucan 150 mg tabs, #1 with 11 rf

## 2016-03-29 NOTE — Telephone Encounter (Signed)
I sent script e-scribe and left a voice message for pt. 

## 2016-03-29 NOTE — Telephone Encounter (Signed)
Pt call to ask if the following med can be called in she said she has a yeast infection .Marland Kitchen.    DIFLUCAN     [facility]acy  [facility] [place]

## 2016-04-01 ENCOUNTER — Telehealth: Payer: Self-pay | Admitting: Family Medicine

## 2016-04-01 NOTE — Telephone Encounter (Signed)
Pt states she got her labs done at [facility] last week.  Pt had an order from Dr Clent RidgesFry. They were to send results to Dr Clent RidgesFry. Pt would like to know if we have received yet? Please call.

## 2016-04-02 ENCOUNTER — Encounter: Payer: Self-pay | Admitting: Family Medicine

## 2016-04-02 NOTE — Telephone Encounter (Signed)
Pt returned call for lab work and state that you can leave a detail msg on her voicemail.

## 2016-04-02 NOTE — Telephone Encounter (Signed)
Pt is returning julie call °

## 2016-04-03 ENCOUNTER — Telehealth: Payer: Self-pay | Admitting: Family Medicine

## 2016-04-03 NOTE — Telephone Encounter (Signed)
Pt was notified of lab results. 

## 2016-04-03 NOTE — Telephone Encounter (Signed)
Pt return Martha [name] call about lab results

## 2016-04-03 NOTE — Telephone Encounter (Signed)
Left message to return call 

## 2016-04-05 ENCOUNTER — Other Ambulatory Visit: Payer: Self-pay | Admitting: *Deleted

## 2016-04-05 ENCOUNTER — Encounter: Payer: Self-pay | Admitting: Family Medicine

## 2016-04-05 MED ORDER — ESCITALOPRAM OXALATE 10 MG PO TABS: 10.0000 mg | ORAL_TABLET | Freq: Every day | ORAL | Status: DC

## 2016-04-05 NOTE — Telephone Encounter (Signed)
Call in Lexapro 10 mg daily, #30 with 5 rf

## 2016-04-05 NOTE — Telephone Encounter (Signed)
Rx done and the pt was notified via Mychart message. 

## 2016-04-10 ENCOUNTER — Encounter: Payer: Self-pay | Admitting: Family Medicine

## 2016-05-08 ENCOUNTER — Other Ambulatory Visit: Payer: Self-pay | Admitting: *Deleted

## 2016-05-08 MED ORDER — ESCITALOPRAM OXALATE 10 MG PO TABS: 10.0000 mg | ORAL_TABLET | Freq: Every day | ORAL | Status: DC

## 2016-07-22 ENCOUNTER — Other Ambulatory Visit: Payer: Self-pay | Admitting: Family Medicine

## 2016-07-23 NOTE — Telephone Encounter (Signed)
She takes 2 qhs. Call in #60 with 5 rf

## 2016-08-21 ENCOUNTER — Telehealth: Payer: Self-pay | Admitting: Family Medicine

## 2016-08-21 NOTE — Telephone Encounter (Signed)
Pt would like to have a zpac for the sinus infection  [facility] [place]  Pt is aware that she may have to come in for a appointment.

## 2016-08-22 MED ORDER — AZITHROMYCIN 250 MG PO TABS: ORAL_TABLET | ORAL | 0 refills | Status: DC

## 2016-08-22 NOTE — Telephone Encounter (Signed)
Call in a Zpack  ?

## 2016-08-22 NOTE — Telephone Encounter (Signed)
Rx sent 

## 2016-09-06 ENCOUNTER — Telehealth: Payer: Self-pay | Admitting: Family Medicine

## 2016-09-06 NOTE — Telephone Encounter (Signed)
Pt need new rx hydrocodone °

## 2016-09-12 MED ORDER — HYDROCODONE-ACETAMINOPHEN 5-325 MG PO TABS: 1.0000 | ORAL_TABLET | Freq: Four times a day (QID) | ORAL | 0 refills | Status: DC | PRN

## 2016-09-12 NOTE — Telephone Encounter (Signed)
Script is ready for pick up here at front office and I spoke with pt.  

## 2016-09-12 NOTE — Telephone Encounter (Signed)
done

## 2016-09-18 ENCOUNTER — Emergency Department
Admission: EM | Admit: 2016-09-18 | Discharge: 2016-09-18 | Disposition: A | Discharge status: Home / Self Care | Payer: BLUE CROSS/BLUE SHIELD | Attending: Emergency Medicine | Admitting: Emergency Medicine

## 2016-09-18 ENCOUNTER — Encounter: Payer: Self-pay | Admitting: Emergency Medicine

## 2016-09-18 DIAGNOSIS — Z791 Long term (current) use of non-steroidal anti-inflammatories (NSAID): Secondary | ICD-10-CM | POA: Insufficient documentation

## 2016-09-18 DIAGNOSIS — G8918 Other acute postprocedural pain: Secondary | ICD-10-CM

## 2016-09-18 DIAGNOSIS — M25561 Pain in right knee: Secondary | ICD-10-CM | POA: Diagnosis not present

## 2016-09-18 DIAGNOSIS — F909 Attention-deficit hyperactivity disorder, unspecified type: Secondary | ICD-10-CM | POA: Insufficient documentation

## 2016-09-18 DIAGNOSIS — Z79899 Other long term (current) drug therapy: Secondary | ICD-10-CM | POA: Diagnosis not present

## 2016-09-18 MED ORDER — HYDROXYZINE HCL 25 MG PO TABS
25.0000 mg | ORAL_TABLET | Freq: Once | ORAL | Status: AC
  Administered 2016-09-18: 25 mg via ORAL
  Filled 2016-09-18: qty 1

## 2016-09-18 MED ORDER — DIAZEPAM 5 MG/ML IJ SOLN
5.0000 mg | Freq: Once | INTRAMUSCULAR | Status: AC
  Administered 2016-09-18: 5 mg via INTRAVENOUS
  Filled 2016-09-18: qty 2

## 2016-09-18 MED ORDER — ONDANSETRON HCL 4 MG/2ML IJ SOLN
4.0000 mg | Freq: Once | INTRAMUSCULAR | Status: AC
  Administered 2016-09-18: 4 mg via INTRAVENOUS
  Filled 2016-09-18: qty 2

## 2016-09-18 MED ORDER — HYDROMORPHONE HCL 1 MG/ML IJ SOLN
INTRAMUSCULAR | Status: AC
  Filled 2016-09-18: qty 1

## 2016-09-18 MED ORDER — HYDROMORPHONE HCL 1 MG/ML IJ SOLN
1.0000 mg | Freq: Once | INTRAMUSCULAR | Status: AC
  Administered 2016-09-18: 1 mg via INTRAVENOUS

## 2016-09-18 MED ORDER — HYDROMORPHONE HCL 2 MG PO TABS: 2.0000 mg | ORAL_TABLET | Freq: Four times a day (QID) | ORAL | 0 refills | Status: AC | PRN

## 2016-09-18 MED ORDER — LIDOCAINE 5 % EX PTCH: 1.0000 | MEDICATED_PATCH | CUTANEOUS | 0 refills | Status: DC

## 2016-09-18 MED ORDER — HYDROMORPHONE HCL 2 MG PO TABS
2.0000 mg | ORAL_TABLET | Freq: Once | ORAL | Status: AC
  Administered 2016-09-18: 2 mg via ORAL
  Filled 2016-09-18: qty 1

## 2016-09-18 MED ORDER — ONDANSETRON HCL 4 MG/2ML IJ SOLN
INTRAMUSCULAR | Status: AC
  Filled 2016-09-18: qty 2

## 2016-09-18 MED ORDER — ONDANSETRON HCL 4 MG/2ML IJ SOLN
4.0000 mg | Freq: Once | INTRAMUSCULAR | Status: AC
  Administered 2016-09-18: 4 mg via INTRAVENOUS

## 2016-09-18 MED ORDER — LIDOCAINE 5 % EX PTCH
1.0000 | MEDICATED_PATCH | CUTANEOUS | Status: DC
  Administered 2016-09-18: 1 via TRANSDERMAL
  Filled 2016-09-18: qty 1

## 2016-09-18 MED ORDER — MORPHINE SULFATE (PF) 4 MG/ML IV SOLN
4.0000 mg | Freq: Once | INTRAVENOUS | Status: AC
  Administered 2016-09-18: 4 mg via INTRAVENOUS
  Filled 2016-09-18: qty 1

## 2016-09-18 NOTE — ED Provider Notes (Signed)
[facility] Emergency Department Provider Note   ____________________________________________   First MD Initiated Contact with Patient 09/18/16 [phone]     (approximate)  I have reviewed the triage vital signs and the nursing notes.   HISTORY  Chief Complaint Post-op Problem    HPI Martha [name] is a 45 y.o. female who comes into the hospital today with some right knee pain. [facility] [place] [facility] [place]. The patient reports that is was late in the day and they left around 6pm. The patient was given some oxycodone before she left and she developed some itching. The patient went to pick up her medicine at the pharmacy and they think they gave them the wrong medicine. The patient reports that she took 2 oxycodone at 10 PM and then took 3 more at 2 AM. The patient reports that the itching is unbearable. She was given hydroxyzine which is not helping and the Benadryl also did not help. They also did not give her a muscle relaxer. The patient reports that the pain is gone to the point where it excruciating and she cannot tolerate it. They have iced it all night and they have attempted to elevate her leg. They could not wait until this morning so they came in for evaluation.   Past Medical History:  Diagnosis Date  . ADHD (attention deficit hyperactivity disorder)   . Anxiety   . Depression   . Insomnia   . Interstitial cystitis    sees Dr. Patsi Searsannenbaum   . Migraine headache     Patient Active Problem List   Diagnosis Date Noted  . Migraine headache 11/23/2014  . GASTROENTERITIS, VIRAL 11/13/2010  . BACK PAIN, THORACIC REGION 05/24/2009  . Insomnia 02/27/2009  . ACUTE BRONCHITIS 07/13/2008  . Anxiety state 12/01/2007  . Depression 12/01/2007  . Attention deficit hyperactivity disorder (ADHD) 12/01/2007  . ACUTE SINUSITIS, UNSPECIFIED 12/01/2007  . ALLERGIC RHINITIS 12/01/2007    History reviewed. No pertinent surgical  history.  Prior to Admission medications   Medication Sig Start Date End Date Taking? Authorizing Provider  azithromycin (ZITHROMAX) 250 MG tablet Use as directed 08/22/16   Nelwyn SalisburyStephen A Fry, MD  cyclobenzaprine (FLEXERIL) 10 MG tablet Take 1 tablet (10 mg total) by mouth 3 (three) times daily as needed for muscle spasms. 11/23/14   Nelwyn SalisburyStephen A Fry, MD  escitalopram (LEXAPRO) 10 MG tablet Take 1 tablet (10 mg total) by mouth daily. 05/08/16   Nelwyn SalisburyStephen A Fry, MD  fluconazole (DIFLUCAN) 150 MG tablet Take 1 tablet (150 mg total) by mouth once. 03/29/16   Nelwyn SalisburyStephen A Fry, MD  HYDROcodone-acetaminophen (NORCO) 5-325 MG tablet Take 1 tablet by mouth every 6 (six) hours as needed for moderate pain. 09/12/16   Nelwyn SalisburyStephen A Fry, MD  LORazepam (ATIVAN) 1 MG tablet TAKE 1 TABLET BY MOUTH 3 TIMES A DAY AS NEEDED 11/29/15   Nelwyn SalisburyStephen A Fry, MD  meclizine (ANTIVERT) 25 MG tablet TAKE 1 TABLET (25 MG TOTAL) BY MOUTH EVERY 4 (FOUR) HOURS AS NEEDED FOR DIZZINESS. 03/27/16   Nelwyn SalisburyStephen A Fry, MD  meloxicam (MOBIC) 15 MG tablet Take 1 tablet (15 mg total) by mouth daily. 11/29/15   Nelwyn SalisburyStephen A Fry, MD  methylphenidate (RITALIN LA) 20 MG 24 hr capsule Take 1 capsule (20 mg total) by mouth every morning. 03/26/16   Nelwyn SalisburyStephen A Fry, MD  valACYclovir (VALTREX) 1000 MG tablet Take 1 tablet (1,000 mg total) by mouth 3 (three) times daily as needed. 10/25/15  Nelwyn Salisbury, MD  zolmitriptan (ZOMIG) 5 MG tablet Take 1 tablet (5 mg total) by mouth as needed for migraine. 03/26/16 03/26/17  Nelwyn Salisbury, MD  zolpidem (AMBIEN) 10 MG tablet TAKE 2 TABLETS BY MOUTH AT BEDTIME AS NEEDED 07/23/16   Nelwyn Salisbury, MD    Allergies Oxycodone and Vicodin [hydrocodone-acetaminophen]  History reviewed. No pertinent family history.  Social History Social History  Substance Use Topics  . Smoking status: Never Smoker  . Smokeless tobacco: Never Used  . Alcohol use 0.0 oz/week     Comment: occ    Review of Systems Constitutional: No fever/chills Eyes: No  visual changes. ENT: No sore throat. Cardiovascular: Denies chest pain. Respiratory: Denies shortness of breath. Gastrointestinal: No abdominal pain.  No nausea, no vomiting.  No diarrhea.  No constipation. Genitourinary: Negative for dysuria. Musculoskeletal: Right knee pain Skin: Negative for rash. Neurological: Negative for headaches, focal weakness or numbness.  10-point ROS otherwise negative.  ____________________________________________   PHYSICAL EXAM:  VITAL SIGNS: ED Triage Vitals  Enc Vitals Group     BP 09/18/16 0532 (!) 153/92     Pulse Rate 09/18/16 0532 97     Resp 09/18/16 0532 18     Temp 09/18/16 0532 97.8 F (36.6 C)     Temp Source 09/18/16 0532 Oral     SpO2 09/18/16 0532 100 %     Weight 09/18/16 0531 137 lb (62.1 kg)     Height 09/18/16 0531 5\' 7"  (1.702 m)     Head Circumference --      Peak Flow --      Pain Score 09/18/16 0531 10     Pain Loc --      Pain Edu? --      Excl. in GC? --     Constitutional: Alert and oriented. Well appearing and in Moderate distress. Eyes: Conjunctivae are normal. PERRL. EOMI. Head: Atraumatic. Nose: No congestion/rhinnorhea. Mouth/Throat: Mucous membranes are moist.  Oropharynx non-erythematous. Cardiovascular: Normal rate, regular rhythm. Grossly normal heart sounds.  Good peripheral circulation. Respiratory: Normal respiratory effort.  No retractions. Lungs CTAB. Gastrointestinal: Soft and nontender. No distention. Active bowel sounds Musculoskeletal: Right knee is in a brace and wrapped with some distinct tenderness to pain unable to move.  Neurologic:  Normal speech and language.  Skin:  Skin is warm, dry and intact.  Psychiatric: Mood and affect are normal.   ____________________________________________   LABS (all labs ordered are listed, but only abnormal results are displayed)  Labs Reviewed - No data to  display ____________________________________________  EKG  none ____________________________________________  RADIOLOGY  none ____________________________________________   PROCEDURES  Procedure(s) performed: None  Procedures  Critical Care performed: No  ____________________________________________   INITIAL IMPRESSION / ASSESSMENT AND PLAN / ED COURSE  Pertinent labs & imaging results that were available during my care of the patient were reviewed by me and considered in my medical decision making (see chart for details).  This is a 45 year old female who comes into the hospital today with some pain after having surgery yesterday. The patient is in some severe pain. It is likely the patient's block has worn off. I'll give the patient a shot of morphine as well as Zofran. I will also give her some Valium and hydroxyzine. I will reassess the patient.  Clinical Course    The patient's care will be signed out to Dr. Huel Cote who will reassess the patient's pain  ____________________________________________   FINAL CLINICAL IMPRESSION(S) / ED DIAGNOSES  Final diagnoses:  Post-operative pain  Acute pain of right knee      NEW MEDICATIONS STARTED DURING THIS VISIT:  New Prescriptions   No medications on file     Note:  This document was prepared using Dragon voice recognition software and may include unintentional dictation errors.    Rebecka ApleyAllison P Webster, MD 09/18/16 [phone]

## 2016-09-18 NOTE — Discharge Instructions (Signed)
Please return immediately if condition worsens. Please contact her primary physician or the physician you were given for referral. If you have any specialist physicians involved in her treatment and plan please also contact them. Thank you for using [facility].   Since Dilaudid does not have Tylenol in it you can also supplement with extra strength Tylenol one tablet every 6 hours. Rest and elevate the right leg. Return here especially for fever, abnormal postoperative swelling or any other new concerns.

## 2016-09-18 NOTE — ED Provider Notes (Signed)
-----------------------------------------   9:09 AM on 09/18/2016 -----------------------------------------   Blood pressure 137/73, pulse 80, temperature 97.8 F (36.6 C), temperature source Oral, resp. rate 20, height 5\' 7"  (1.702 m), weight 137 lb (62.1 kg), last menstrual period 08/28/2016, SpO2 98 %.  Assuming care from Dr. Zenda AlpersWebster.  In short, Martha [name] is a 45 y.o. female with a chief complaint of Post-op Problem .  Refer to the original H&P for additional details.  The current plan of care is to follow the patient here is had improvement from her allergic reaction from her oxycodone pain medication. Patient had some morphine prior to my evaluation I added lidocaine patch along with some oral Dilaudid. The patient states that she has been on Dilaudid before which worked well for her for pain and she had no adverse effects.  " New Prescriptions   HYDROMORPHONE (DILAUDID) 2 MG TABLET    Take 1 tablet (2 mg total) by mouth every 6 (six) hours as needed for severe pain.   LIDOCAINE (LIDODERM) 5 %    Place 1 patch onto the skin daily.  " She has spoken to the surgeon already and they are aware that she had issues with pain control, etc.  Patient was advised to return immediately if condition worsens. Patient was advised to follow up with their primary care physician or other specialized physicians involved in their outpatient care. The patient and/or family member/power of attorney had laboratory results reviewed at the bedside. All questions and concerns were addressed and appropriate discharge instructions were distributed by the nursing staff.     Jennye MoccasinBrian S Rickayla Wieland, MD 09/18/16 [phone]

## 2016-09-18 NOTE — ED Triage Notes (Signed)
Pt had knee surgery yesterday in [place], was given oxycodone for pain but made her itch. Was given atarax for itching but states pain is not tolerable.

## 2016-12-16 ENCOUNTER — Ambulatory Visit (INDEPENDENT_AMBULATORY_CARE_PROVIDER_SITE_OTHER): Payer: BLUE CROSS/BLUE SHIELD | Admitting: Family Medicine

## 2016-12-16 ENCOUNTER — Encounter: Payer: Self-pay | Admitting: Family Medicine

## 2016-12-16 VITALS — BP 138/97 | HR 76 | Temp 98.3°F | Ht 67.0 in | Wt 138.0 lb

## 2016-12-16 DIAGNOSIS — J209 Acute bronchitis, unspecified: Secondary | ICD-10-CM | POA: Diagnosis not present

## 2016-12-16 DIAGNOSIS — F411 Generalized anxiety disorder: Secondary | ICD-10-CM | POA: Diagnosis not present

## 2016-12-16 MED ORDER — ESCITALOPRAM OXALATE 10 MG PO TABS: 10.0000 mg | ORAL_TABLET | Freq: Every day | ORAL | 3 refills | Status: DC

## 2016-12-16 MED ORDER — HYDROCODONE-HOMATROPINE 5-1.5 MG/5ML PO SYRP: 5.0000 mL | ORAL_SOLUTION | ORAL | 0 refills | Status: DC | PRN

## 2016-12-16 MED ORDER — AMOXICILLIN-POT CLAVULANATE 875-125 MG PO TABS: 1.0000 | ORAL_TABLET | Freq: Two times a day (BID) | ORAL | 0 refills | Status: DC

## 2016-12-16 NOTE — Progress Notes (Signed)
   Subjective:    Patient ID: Martha [name], female    DOB: 1971/07/30, 46 y.o.   MRN: [id number]  HPI Here for 5 days of chest tightness and coughing up brown sputum. Also her anxiety has been more of a problem lately and she wants to get back on Lexapro. This worked well for her in the past.    Review of Systems  Constitutional: Negative.   HENT: Positive for congestion and postnasal drip. Negative for sinus pain, sinus pressure and sore throat.   Eyes: Negative.   Respiratory: Positive for cough and chest tightness.   Psychiatric/Behavioral: Positive for decreased concentration and sleep disturbance. Negative for dysphoric mood. The patient is nervous/anxious.        Objective:   Physical Exam  Constitutional: She is oriented to person, place, and time. She appears well-developed and well-nourished.  HENT:  Right Ear: External ear normal.  Left Ear: External ear normal.  Nose: Nose normal.  Mouth/Throat: Oropharynx is clear and moist.  Eyes: Conjunctivae are normal.  Neck: No thyromegaly present.  Pulmonary/Chest: Effort normal. She has no rales.  Scattered rhonchi and wheezes   Lymphadenopathy:    She has no cervical adenopathy.  Neurological: She is alert and oriented to person, place, and time.  Psychiatric: She has a normal mood and affect. Her behavior is normal. Thought content normal.          Assessment & Plan:  Treat the bronchitis with Augmentin and Mucinex. Treat the anxiety with Lexapro 10 mg daily.  Gershon CraneStephen Damacio Weisgerber, MD

## 2016-12-16 NOTE — Progress Notes (Signed)
Pre visit review using our clinic review tool, if applicable. No additional management support is needed unless otherwise documented below in the visit note. 

## 2016-12-25 ENCOUNTER — Telehealth: Payer: Self-pay | Admitting: Family Medicine

## 2016-12-25 MED ORDER — AZITHROMYCIN 250 MG PO TABS: ORAL_TABLET | ORAL | 0 refills | Status: DC

## 2016-12-25 NOTE — Telephone Encounter (Signed)
Rx sent 

## 2016-12-25 NOTE — Telephone Encounter (Signed)
Call in a Zpack  ?

## 2016-12-25 NOTE — Addendum Note (Signed)
Addended by: Marcell AngerSELF, Lindaann Gradilla E on: 12/25/2016 12:34 PM   Modules accepted: Orders

## 2016-12-25 NOTE — Telephone Encounter (Signed)
Pt calling stating that she does not feel any better and would like to see if Dr. Clent RidgesFry would call something in for her. [facility] [place]

## 2017-01-07 ENCOUNTER — Telehealth: Payer: Self-pay | Admitting: Family Medicine

## 2017-01-07 MED ORDER — FLUCONAZOLE 150 MG PO TABS: 150.0000 mg | ORAL_TABLET | Freq: Once | ORAL | 11 refills | Status: AC

## 2017-01-07 NOTE — Telephone Encounter (Signed)
Patient wanted to know if a Diflucan could be called in the pharmacy. Patients states that she was on antibiotics prescribed by the MD.  [facility] [address] [place]   Contact Info: [phone]

## 2017-01-07 NOTE — Telephone Encounter (Signed)
Call in Diflucan 150 mg to take one now, #1 with 11 rf

## 2017-04-08 ENCOUNTER — Other Ambulatory Visit: Payer: Self-pay | Admitting: Family Medicine

## 2017-04-08 NOTE — Telephone Encounter (Signed)
Call in #60 with 5 rf 

## 2017-04-09 ENCOUNTER — Telehealth: Payer: Self-pay | Admitting: Family Medicine

## 2017-04-09 MED ORDER — SERTRALINE HCL 50 MG PO TABS
50.0000 mg | ORAL_TABLET | Freq: Every day | ORAL | 2 refills | Status: DC
Start: 2017-04-09 — End: 2017-11-05

## 2017-04-09 NOTE — Telephone Encounter (Signed)
I would first recommend she double the dose to 20 mg (take 2 a day) and give this a week or two

## 2017-04-09 NOTE — Telephone Encounter (Signed)
Noted. Call in Zoloft 50 mg daily, #30 with 2 rf

## 2017-04-09 NOTE — Telephone Encounter (Signed)
I spoke with pt and [facility] 

## 2017-04-09 NOTE — Telephone Encounter (Signed)
I spoke with pt and she has already stopped the Lexapro. She gained 45 pounds once starting the medication and did not feel that the medication was really helping, she try to watch her diet but weight still went up.

## 2017-04-09 NOTE — Telephone Encounter (Signed)
Pt would like to try another anxiety med. Pt has been taking lexapro for about 3 wks and feels like med is not working . [facility] [place]

## 2017-06-12 LAB — HEPATITIS B SURFACE ANTIGEN: Hepatitis B Surface Ag: NONREACTIVE

## 2017-07-17 ENCOUNTER — Encounter: Payer: Self-pay | Admitting: Family Medicine

## 2017-10-12 ENCOUNTER — Other Ambulatory Visit: Payer: Self-pay | Admitting: Family Medicine

## 2017-10-13 NOTE — Telephone Encounter (Signed)
Call in #60 with 5 rf 

## 2017-10-13 NOTE — Telephone Encounter (Signed)
Rx was called in documented as printed but Rx was called into pharmacy.

## 2017-10-13 NOTE — Telephone Encounter (Signed)
Sent to PCP for approval.  

## 2017-10-28 HISTORY — PX: OTHER SURGICAL HISTORY: SHX169

## 2017-11-04 ENCOUNTER — Encounter: Payer: BLUE CROSS/BLUE SHIELD | Admitting: Family Medicine

## 2017-11-05 ENCOUNTER — Ambulatory Visit (INDEPENDENT_AMBULATORY_CARE_PROVIDER_SITE_OTHER): Payer: BLUE CROSS/BLUE SHIELD | Admitting: Family Medicine

## 2017-11-05 ENCOUNTER — Encounter: Payer: Self-pay | Admitting: Family Medicine

## 2017-11-05 VITALS — BP 118/80 | HR 61 | Temp 98.5°F | Ht 66.5 in | Wt 133.0 lb

## 2017-11-05 DIAGNOSIS — Z0000 Encounter for general adult medical examination without abnormal findings: Secondary | ICD-10-CM

## 2017-11-05 DIAGNOSIS — R5383 Other fatigue: Secondary | ICD-10-CM

## 2017-11-05 LAB — POCT URINALYSIS DIPSTICK
Bilirubin, UA: NEGATIVE
Blood, UA: NEGATIVE
Glucose, UA: NEGATIVE
Ketones, UA: 5
LEUKOCYTES UA: NEGATIVE
NITRITE UA: NEGATIVE
PROTEIN UA: NEGATIVE
Spec Grav, UA: 1.025 (ref 1.010–1.025)
Urobilinogen, UA: 0.2 E.U./dL
pH, UA: 6 (ref 5.0–8.0)

## 2017-11-05 MED ORDER — VALACYCLOVIR HCL 1 G PO TABS: 1000.0000 mg | ORAL_TABLET | Freq: Three times a day (TID) | ORAL | 5 refills | Status: DC | PRN

## 2017-11-05 MED ORDER — ZOLMITRIPTAN 5 MG PO TABS: 5.0000 mg | ORAL_TABLET | ORAL | 3 refills | Status: DC | PRN

## 2017-11-05 MED ORDER — LORAZEPAM 1 MG PO TABS: 1.0000 mg | ORAL_TABLET | Freq: Three times a day (TID) | ORAL | 5 refills | Status: DC | PRN

## 2017-11-05 MED ORDER — LISDEXAMFETAMINE DIMESYLATE 20 MG PO CAPS: 20.0000 mg | ORAL_CAPSULE | Freq: Every day | ORAL | 0 refills | Status: DC

## 2017-11-05 NOTE — Progress Notes (Signed)
   Subjective:    Patient ID: Martha [name], female    DOB: 20-Jul-1971, 47 y.o.   MRN: [id number]  HPI Here for a well exam. She recently got married again and she is very happy. She has stopped the Lexapro and has done well. She asks to try Vyvanse again for her attention issues. This worked well a few years ago and now the demands of her job make her feel as if she cannot keep up. She has trouble concentrating and completing tasks on time.     Review of Systems  Constitutional: Negative.   HENT: Negative.   Eyes: Negative.   Respiratory: Negative.   Cardiovascular: Negative.   Gastrointestinal: Negative.   Genitourinary: Negative for decreased urine volume, difficulty urinating, dyspareunia, dysuria, enuresis, flank pain, frequency, hematuria, pelvic pain and urgency.  Musculoskeletal: Negative.   Skin: Negative.   Neurological: Negative.   Psychiatric/Behavioral: Negative.        Objective:   Physical Exam  Constitutional: She is oriented to person, place, and time. She appears well-developed and well-nourished. No distress.  HENT:  Head: Normocephalic and atraumatic.  Right Ear: External ear normal.  Left Ear: External ear normal.  Nose: Nose normal.  Mouth/Throat: Oropharynx is clear and moist. No oropharyngeal exudate.  Eyes: Conjunctivae and EOM are normal. Pupils are equal, round, and reactive to light. No scleral icterus.  Neck: Normal range of motion. Neck supple. No JVD present. No thyromegaly present.  Cardiovascular: Normal rate, regular rhythm, normal heart sounds and intact distal pulses. Exam reveals no gallop and no friction rub.  No murmur heard. Pulmonary/Chest: Effort normal and breath sounds normal. No respiratory distress. She has no wheezes. She has no rales. She exhibits no tenderness.  Abdominal: Soft. Bowel sounds are normal. She exhibits no distension and no mass. There is no tenderness. There is no rebound and no guarding.  Musculoskeletal: Normal  range of motion. She exhibits no edema or tenderness.  Lymphadenopathy:    She has no cervical adenopathy.  Neurological: She is alert and oriented to person, place, and time. She has normal reflexes. No cranial nerve deficit. She exhibits normal muscle tone. Coordination normal.  Skin: Skin is warm and dry. No rash noted. No erythema.  Psychiatric: She has a normal mood and affect. Her behavior is normal. Judgment and thought content normal.          Assessment & Plan:  Well exam. We discussed diet and exercise. Get fasting labs. Try Vyvanse 20 mg daily.  Gershon CraneStephen Fry, MD

## 2017-11-06 LAB — HEPATIC FUNCTION PANEL
ALBUMIN: 4.2 g/dL (ref 3.5–5.2)
ALK PHOS: 38 U/L — AB (ref 39–117)
ALT: 9 U/L (ref 0–35)
AST: 15 U/L (ref 0–37)
BILIRUBIN DIRECT: 0.2 mg/dL (ref 0.0–0.3)
BILIRUBIN TOTAL: 0.8 mg/dL (ref 0.2–1.2)
Total Protein: 7 g/dL (ref 6.0–8.3)

## 2017-11-06 LAB — CBC WITH DIFFERENTIAL/PLATELET
BASOS ABS: 0 10*3/uL (ref 0.0–0.1)
Basophils Relative: 0.9 % (ref 0.0–3.0)
Eosinophils Absolute: 0.1 10*3/uL (ref 0.0–0.7)
Eosinophils Relative: 1.4 % (ref 0.0–5.0)
HEMATOCRIT: 38.2 % (ref 36.0–46.0)
Hemoglobin: 12.7 g/dL (ref 12.0–15.0)
LYMPHS PCT: 33.4 % (ref 12.0–46.0)
Lymphs Abs: 1.3 10*3/uL (ref 0.7–4.0)
MCHC: 33.2 g/dL (ref 30.0–36.0)
MCV: 93.4 fl (ref 78.0–100.0)
MONOS PCT: 11.2 % (ref 3.0–12.0)
Monocytes Absolute: 0.4 10*3/uL (ref 0.1–1.0)
NEUTROS ABS: 2 10*3/uL (ref 1.4–7.7)
Neutrophils Relative %: 53.1 % (ref 43.0–77.0)
PLATELETS: 190 10*3/uL (ref 150.0–400.0)
RBC: 4.09 Mil/uL (ref 3.87–5.11)
RDW: 13 % (ref 11.5–15.5)
WBC: 3.8 10*3/uL — ABNORMAL LOW (ref 4.0–10.5)

## 2017-11-06 LAB — LIPID PANEL
CHOL/HDL RATIO: 2
Cholesterol: 163 mg/dL (ref 0–200)
HDL: 74 mg/dL (ref >39.00)
LDL CALC: 80 mg/dL (ref 0–99)
NONHDL: 88.56
Triglycerides: 43 mg/dL (ref 0.0–149.0)
VLDL: 8.6 mg/dL (ref 0.0–40.0)

## 2017-11-06 LAB — BASIC METABOLIC PANEL
BUN: 12 mg/dL (ref 6–23)
CALCIUM: 9.1 mg/dL (ref 8.4–10.5)
CO2: 28 mEq/L (ref 19–32)
Chloride: 103 mEq/L (ref 96–112)
Creatinine, Ser: 0.62 mg/dL (ref 0.40–1.20)
GFR: 109.78 mL/min (ref >60.00)
GLUCOSE: 83 mg/dL (ref 70–99)
Potassium: 3.8 mEq/L (ref 3.5–5.1)
SODIUM: 139 meq/L (ref 135–145)

## 2017-11-06 LAB — VITAMIN B12: Vitamin B-12: 386 pg/mL (ref 211–911)

## 2017-11-06 LAB — TSH: TSH: 0.97 u[IU]/mL (ref 0.35–4.50)

## 2017-11-11 ENCOUNTER — Encounter: Payer: BLUE CROSS/BLUE SHIELD | Admitting: Family Medicine

## 2017-11-25 ENCOUNTER — Encounter: Payer: Self-pay | Admitting: Family Medicine

## 2017-11-25 NOTE — Telephone Encounter (Signed)
First I would suggest she try a lower dose of Vyvanse (10 mg). We can send this in if she wishes

## 2017-11-25 NOTE — Telephone Encounter (Signed)
Call in a Zpack  ?

## 2017-11-26 ENCOUNTER — Other Ambulatory Visit: Payer: Self-pay

## 2017-11-26 MED ORDER — AZITHROMYCIN 250 MG PO TABS: ORAL_TABLET | ORAL | 0 refills | Status: DC

## 2017-11-26 NOTE — Telephone Encounter (Signed)
I gave her #30 of the 20 mg Vyvanse on 11-05-17. Has she been taking 2 at a time or what?

## 2017-12-02 MED ORDER — LISDEXAMFETAMINE DIMESYLATE 10 MG PO CAPS: 10.0000 mg | ORAL_CAPSULE | Freq: Every day | ORAL | 0 refills | Status: DC

## 2017-12-02 NOTE — Telephone Encounter (Signed)
done

## 2018-01-19 ENCOUNTER — Other Ambulatory Visit: Payer: Self-pay | Admitting: Family Medicine

## 2018-01-19 ENCOUNTER — Encounter: Payer: Self-pay | Admitting: Family Medicine

## 2018-01-19 MED ORDER — LISDEXAMFETAMINE DIMESYLATE 10 MG PO CAPS: 10.0000 mg | ORAL_CAPSULE | Freq: Every day | ORAL | 0 refills | Status: DC

## 2018-01-19 NOTE — Telephone Encounter (Signed)
Done

## 2018-01-19 NOTE — Telephone Encounter (Signed)
Last OV 11/05/2017  Last refilled 12/02/2017 disp 30 with no refills  Sent to PCP for approval

## 2018-01-21 NOTE — Telephone Encounter (Signed)
She always has the option to pay cash for it

## 2018-03-19 ENCOUNTER — Telehealth: Payer: Self-pay | Admitting: Family Medicine

## 2018-03-19 NOTE — Telephone Encounter (Signed)
Last OV   Last refilled  

## 2018-03-19 NOTE — Telephone Encounter (Signed)
Last OV 11/05/2017   Last refilled 03/19/2018 disp 30 with no refills   Sent to PCP for approval

## 2018-03-19 NOTE — Telephone Encounter (Signed)
Called pt and left a detailed VM advised pt that they should have refills and that they should contact their pharmacy.

## 2018-03-19 NOTE — Telephone Encounter (Signed)
She already has refills until June 25

## 2018-03-19 NOTE — Telephone Encounter (Signed)
Copied from CRM [id number]. Topic: Quick Communication - Rx Refill/Question >> Mar 19, 2018  8:32 AM Diana Eves B wrote: Medication: lisdexamfetamine (VYVANSE) 10 MG capsule  Has the patient contacted their pharmacy? Yes.   (Agent: If no, request that the patient contact the pharmacy for the refill.) (Agent: If yes, when and what did the pharmacy advise?)  Preferred Pharmacy (with phone number or street name): [id number] [place], [place] - [address]  Agent: Please be advised that RX refills may take up to 3 business days. We ask that you follow-up with your pharmacy.

## 2018-04-16 ENCOUNTER — Encounter: Payer: Self-pay | Admitting: Family Medicine

## 2018-04-16 MED ORDER — LISDEXAMFETAMINE DIMESYLATE 20 MG PO CAPS: 20.0000 mg | ORAL_CAPSULE | Freq: Every day | ORAL | 0 refills | Status: DC

## 2018-04-16 MED ORDER — AZITHROMYCIN 250 MG PO TABS: ORAL_TABLET | ORAL | 0 refills | Status: DC

## 2018-04-16 NOTE — Telephone Encounter (Signed)
Done

## 2018-04-20 ENCOUNTER — Other Ambulatory Visit: Payer: Self-pay | Admitting: Family Medicine

## 2018-04-20 NOTE — Telephone Encounter (Signed)
Call in #60 with 5 rf 

## 2018-04-20 NOTE — Telephone Encounter (Signed)
Last OV 11/05/2017   Last refilled 10/13/2017 disp 60 with 5 refills    Sent to PCP for approval

## 2018-05-07 ENCOUNTER — Other Ambulatory Visit: Payer: Self-pay | Admitting: Obstetrics and Gynecology

## 2018-05-07 DIAGNOSIS — Z1231 Encounter for screening mammogram for malignant neoplasm of breast: Secondary | ICD-10-CM

## 2018-06-15 ENCOUNTER — Ambulatory Visit: Payer: Self-pay

## 2018-06-30 ENCOUNTER — Other Ambulatory Visit: Payer: Self-pay | Admitting: Family Medicine

## 2018-06-30 NOTE — Telephone Encounter (Signed)
Sent to PCP to advise 

## 2018-06-30 NOTE — Telephone Encounter (Signed)
Vyvanse 20 mg refill Last Refill:04/16/18 # 30 Last OV: 11/05/17 PCP: Clent Ridges [facility] [phone]

## 2018-06-30 NOTE — Telephone Encounter (Signed)
Copied from CRM [id number]. Topic: Quick Communication - Rx Refill/Question >> Jun 30, 2018  1:29 PM Angela Nevin wrote: Medication: lisdexamfetamine (VYVANSE) 20 MG capsule [[id number]    Has the patient contacted their pharmacy? Pt was advised by pharmacy to call office.   Preferred Pharmacy (with phone number or street name): [id number] [place], [place] - [address] [phone] (Phone) [phone] (Fax)

## 2018-07-01 MED ORDER — LISDEXAMFETAMINE DIMESYLATE 20 MG PO CAPS: 20.0000 mg | ORAL_CAPSULE | Freq: Every day | ORAL | 0 refills | Status: DC

## 2018-07-01 NOTE — Telephone Encounter (Signed)
Done

## 2018-07-17 ENCOUNTER — Encounter: Payer: Self-pay | Admitting: Family Medicine

## 2018-07-17 NOTE — Telephone Encounter (Signed)
Dr. Clent RidgesFry please advise on the zpak the pt has requested.  thanks

## 2018-07-20 ENCOUNTER — Ambulatory Visit: Payer: Self-pay

## 2018-07-20 NOTE — Telephone Encounter (Signed)
Pt. Reports she started having dizziness last Thursday. Went to UC and was diagnosed with bilateral ear infection. Started on antibiotic, prednisone and meclizine. States she is still having "pretty bad dizziness, I can't drive - I had to pull off the road and my husband picked me up." "It feels like I'm falling." Requests appointment with Dr. Clent RidgesFry. Appointment made for tomorrow morning. Instructed if symptoms worsen, to go to the ED - verbalizes understanding. Reason for Disposition . [1] MODERATE dizziness (e.g., interferes with normal activities) AND [2] has NOT been evaluated by physician for this  (Exception: dizziness caused by heat exposure, sudden standing, or poor fluid intake)  Answer Assessment - Initial Assessment Questions 1. DESCRIPTION: "Describe your dizziness."     I feel like I'm falling 2. LIGHTHEADED: "Do you feel lightheaded?" (e.g., somewhat faint, woozy, weak upon standing)     Dizzy 3. VERTIGO: "Do you feel like either you or the room is spinning or tilting?" (i.e. vertigo)     No 4. SEVERITY: "How bad is it?"  "Do you feel like you are going to faint?" "Can you stand and walk?"   - MILD - walking normally   - MODERATE - interferes with normal activities (e.g., work, school)    - SEVERE - unable to stand, requires support to walk, feels like passing out now.      Moderate 5. ONSET:  "When did the dizziness begin?"     This past Thursday 6. AGGRAVATING FACTORS: "Does anything make it worse?" (e.g., standing, change in head position)     In the mornings is fine, and then as the day goes on it gets worse 7. HEART RATE: "Can you tell me your heart rate?" "How many beats in 15 seconds?"  (Note: not all patients can do this)        80-90 8. CAUSE: "What do you think is causing the dizziness?"     Maybe my ears 9. RECURRENT SYMPTOM: "Have you had dizziness before?" If so, ask: "When was the last time?" "What happened that time?"     2 YEARS AGO - Meclizine helped 10. OTHER  SYMPTOMS: "Do you have any other symptoms?" (e.g., fever, chest pain, vomiting, diarrhea, bleeding)       Nausea 11. PREGNANCY: "Is there any chance you are pregnant?" "When was your last menstrual period?"       No  Protocols used: DIZZINESS [facility]- LIGHTHEADEDNESS-A-AH

## 2018-07-21 ENCOUNTER — Ambulatory Visit (INDEPENDENT_AMBULATORY_CARE_PROVIDER_SITE_OTHER): Payer: BLUE CROSS/BLUE SHIELD | Admitting: Family Medicine

## 2018-07-21 ENCOUNTER — Encounter: Payer: Self-pay | Admitting: Family Medicine

## 2018-07-21 VITALS — BP 124/72 | HR 68 | Temp 97.7°F | Wt 135.5 lb

## 2018-07-21 DIAGNOSIS — J019 Acute sinusitis, unspecified: Secondary | ICD-10-CM | POA: Diagnosis not present

## 2018-07-21 DIAGNOSIS — R42 Dizziness and giddiness: Secondary | ICD-10-CM

## 2018-07-21 MED ORDER — DIAZEPAM 5 MG PO TABS: 5.0000 mg | ORAL_TABLET | Freq: Three times a day (TID) | ORAL | 0 refills | Status: DC | PRN

## 2018-07-21 MED ORDER — MECLIZINE HCL 25 MG PO TABS: 25.0000 mg | ORAL_TABLET | ORAL | 2 refills | Status: DC | PRN

## 2018-07-21 NOTE — Telephone Encounter (Signed)
She is seen today

## 2018-07-21 NOTE — Progress Notes (Signed)
   Subjective:    Patient ID: Martha [name], female    DOB: 07-15-1971, 47 y.o.   MRN: [id number]  HPI Here for severe dizziness. About one week ago she developed some dizziness that made her feel like the room was spinning when she moved her head suddenly. She has had vertigo before and she saw not worried at first. However the dizziness became worse and caused a lot of nausea (without vomiting). This has also gotten to the point where she can feel like the room is spinning even when sitting perfectly still. Ho headache, no ear pain, no trouble hearing, no tinnitus. [facility]18 and was diagnosed with a sinus infection. She was started on Augmentin for 7 days, Prednisone 20 mg daily for 5 days, Atrovent nasal sprays, and Meclizine as needed. She is drinking plenty of fluids. Today she is here because she is not better. She denies any fever, no sinus pressure, no mucus from the nose, no ST, and no coughing.    Review of Systems  Constitutional: Negative.   HENT: Negative for congestion, ear pain, hearing loss, postnasal drip, sinus pressure, sinus pain and sore throat.   Eyes: Negative.   Respiratory: Negative.   Cardiovascular: Negative.   Neurological: Positive for dizziness. Negative for tremors, seizures, syncope, facial asymmetry, speech difficulty, weakness, light-headedness, numbness and headaches.       Objective:   Physical Exam  Constitutional: She is oriented to person, place, and time. She appears well-developed and well-nourished.  HENT:  Right Ear: External ear normal.  Left Ear: External ear normal.  Nose: Nose normal.  Mouth/Throat: Oropharynx is clear and moist.  Eyes: Pupils are equal, round, and reactive to light. Conjunctivae and EOM are normal.  Neck: Neck supple. No thyromegaly present.  Cardiovascular: Normal rate, regular rhythm, normal heart sounds and intact distal pulses.  Pulmonary/Chest: Effort normal and breath sounds  normal. No stridor. No respiratory distress. She has no wheezes. She has no rales.  Neurological: She is alert and oriented to person, place, and time. No cranial nerve deficit. She exhibits normal muscle tone. Coordination normal.  Her gait is a little unsteady if she moves quickly           Assessment & Plan:  This does not seem to be due to a sinus infection, though I advised her to finish up the Prednisone and the Augmentin. This seems to be vertigo, likely from a vestibular source. She is taking Zyrtec 10 mg bid . We will add Meclizine 25 mg to use as needed, along with Valium 5 mg tid. Recheck prn. Gershon CraneStephen Yang Rack, MD

## 2018-08-25 ENCOUNTER — Encounter: Payer: Self-pay | Admitting: Family Medicine

## 2018-08-25 NOTE — Telephone Encounter (Signed)
Dr. Fry please advise. Thanks  

## 2018-08-25 NOTE — Telephone Encounter (Signed)
Cory please advise in Dr. Claris Che absence.  Thanks

## 2018-08-28 MED ORDER — AZITHROMYCIN 250 MG PO TABS: ORAL_TABLET | ORAL | 0 refills | Status: DC

## 2018-08-28 NOTE — Telephone Encounter (Signed)
zpak sent to the pharmacy

## 2018-08-28 NOTE — Telephone Encounter (Signed)
Call in a Zpack  ?

## 2018-09-01 LAB — BASIC METABOLIC PANEL
BUN: 8 (ref 4–21)
Creatinine: 0.7 (ref 0.5–1.1)
Glucose: 92
Potassium: 3.7 (ref 3.4–5.3)
Sodium: 140 (ref 137–147)

## 2018-09-01 LAB — CBC AND DIFFERENTIAL
HCT: 38 (ref 36–46)
Hemoglobin: 13.1 (ref 12.0–16.0)
Platelets: 209 (ref 150–399)
WBC: 8

## 2018-09-01 LAB — HEPATIC FUNCTION PANEL
ALT: 12 (ref 7–35)
AST: 20 (ref 13–35)
Alkaline Phosphatase: 41 (ref 25–125)
Bilirubin, Total: 0.6

## 2018-09-01 LAB — TSH: TSH: 1.57 (ref 0.41–5.90)

## 2018-09-01 LAB — VITAMIN B12: VITAMIN B 12: 567

## 2018-09-04 ENCOUNTER — Telehealth: Payer: Self-pay | Admitting: Family Medicine

## 2018-09-04 NOTE — Telephone Encounter (Signed)
Copied from CRM [id number]. Topic: Quick Communication - See Telephone Encounter >> Sep 04, 2018  3:15 PM Lorrine Kin, [place] wrote: CRM for notification. See Telephone encounter for: 09/04/18. Patient calling and states that she would like to increase the lisdexamfetamine (VYVANSE) 20 MG capsule  to the next step up. Please advise. [id number] [place], [place] - [address]

## 2018-09-07 NOTE — Telephone Encounter (Signed)
Dr. Clent Ridges please advise on increasing the vyvanse.

## 2018-09-10 MED ORDER — LISDEXAMFETAMINE DIMESYLATE 30 MG PO CAPS: 30.0000 mg | ORAL_CAPSULE | Freq: Every day | ORAL | 0 refills | Status: DC

## 2018-09-10 NOTE — Telephone Encounter (Signed)
She can try Vyvanse 30 mg

## 2018-09-14 NOTE — Telephone Encounter (Signed)
Called and spoke with pt and she is aware of the change in dosage of the vyvanse.  Nothing further is needed.

## 2018-09-15 ENCOUNTER — Other Ambulatory Visit: Payer: Self-pay | Admitting: Neurology

## 2018-09-15 DIAGNOSIS — R42 Dizziness and giddiness: Secondary | ICD-10-CM

## 2018-09-16 ENCOUNTER — Ambulatory Visit
Admission: RE | Admit: 2018-09-16 | Discharge: 2018-09-16 | Disposition: A | Discharge status: Home / Self Care | Payer: BLUE CROSS/BLUE SHIELD | Source: Ambulatory Visit | Attending: Neurology | Admitting: Neurology

## 2018-09-16 DIAGNOSIS — R42 Dizziness and giddiness: Secondary | ICD-10-CM | POA: Diagnosis not present

## 2018-10-02 ENCOUNTER — Ambulatory Visit: Payer: BLUE CROSS/BLUE SHIELD

## 2018-10-26 ENCOUNTER — Encounter: Payer: Self-pay | Admitting: Urology

## 2018-10-26 ENCOUNTER — Ambulatory Visit: Payer: BLUE CROSS/BLUE SHIELD | Admitting: Urology

## 2018-10-26 ENCOUNTER — Ambulatory Visit: Payer: Self-pay | Admitting: Urology

## 2018-10-26 VITALS — BP 136/81 | HR 73 | Ht 66.5 in | Wt 137.0 lb

## 2018-10-26 DIAGNOSIS — N3020 Other chronic cystitis without hematuria: Secondary | ICD-10-CM

## 2018-10-26 DIAGNOSIS — R350 Frequency of micturition: Secondary | ICD-10-CM

## 2018-10-26 MED ORDER — SODIUM BICARBONATE 8.4 % IV SOLN
11.0000 mL | Freq: Once | INTRAVENOUS | Status: AC
  Administered 2018-10-26: 11 mL

## 2018-10-26 MED ORDER — METHOCARBAMOL 500 MG PO TABS: 500.0000 mg | ORAL_TABLET | Freq: Three times a day (TID) | ORAL | 4 refills | Status: DC | PRN

## 2018-10-26 MED ORDER — TRAMADOL HCL 50 MG PO TABS: 50.0000 mg | ORAL_TABLET | Freq: Four times a day (QID) | ORAL | 0 refills | Status: DC | PRN

## 2018-10-26 MED ORDER — OXYBUTYNIN CHLORIDE ER 10 MG PO TB24: 10.0000 mg | ORAL_TABLET | Freq: Every day | ORAL | 4 refills | Status: DC

## 2018-10-26 NOTE — Progress Notes (Unsigned)
Bladder Rescue Solution Instillation  Due to IC patient is present today for a Rescue Solution Treatment.  Patient was cleaned and prepped in a sterile fashion with betadine and lidocaine 2% jelly was instilled into the urethra.  A 14 FR catheter was inserted, urine return was noted 100ml, urine was yellow in color.  Instilled a solution consisting of 8ml of Sodium Bicarb, 2 ml Lidocaine and 1 ml of Heparin. The catheter was then removed. Patient tolerated well, no complications were noted.   Performed by: Teressa Lowerarrie Garrison, CMA

## 2018-10-26 NOTE — Progress Notes (Unsigned)
10/26/2018 2:48 PM   Martha FerryNatalie J [name]  10-Oct-1971 [id number]  Referring provider: Nelwyn SalisburyFry, Stephen A, MD [address] [place], [place] [address]  Chief Complaint  Patient presents with  . New Patient (Initial Visit)     HPI: I was consulted to assess the patient for pelvic pain.  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  She saw Dr. Karie Schwalbe for interstitial cystitis for 2 decades.  She describes multiple rescue treatments at our office and likely with intramuscular Toradol effective in the past.  She stopped her IC bladder medication 2 years ago.  Approximately 3 weeks ago she flared with a typical pressure and suprapubic pain with spasms and voiding every 20 minutes.  She has dyspareunia.  [place] was seen in [place] in [place] and was given oxybutynin alTRAM methenamine and she is also taking Azo-Standard.  It helped the frequency but not the pain minimally.  She is quite anxious about her symptoms and frustrated.  She is to have a D&C in [place] by 1 of the local gynecologist.  At baseline she voids every 4 or 5 hours with no dyspareunia.  She may have had a negative culture or at least a negative urinalysis when seen in [place]  Modifying factors: There are no other modifying factors  Associated signs and symptoms: There are no other associated signs and symptoms Aggravating and relieving factors: There are no other aggravating or relieving factors Severity: Moderate Duration: Persistent   PMH: Past Medical History:  Diagnosis Date  . ADHD (attention deficit hyperactivity disorder)   . Anxiety   . Depression   . Insomnia   . Interstitial cystitis    sees Dr. Patsi Searsannenbaum   . Migraine headache     Surgical History: No past surgical history on file.  Home Medications:  Allergies as of 10/26/2018      Reactions   Oxycodone Rash      Medication List       Accurate as of October 26, 2018  2:48 PM. Always use your most recent med list.          azithromycin 250 MG tablet Commonly known as:  ZITHROMAX Take 2 tablets by mouth today then 1 daily until gone   cetirizine 10 MG tablet Commonly known as:  ZYRTEC Take 10 mg by mouth daily.   diazepam 5 MG tablet Commonly known as:  VALIUM Take 1 tablet (5 mg total) by mouth every 8 (eight) hours as needed (dizziness).   ELMIRON 100 MG capsule Generic drug:  pentosan polysulfate Take 200 mg by mouth 2 (two) times daily.   lisdexamfetamine 30 MG capsule Commonly known as:  VYVANSE Take 1 capsule (30 mg total) by mouth daily.   meclizine 25 MG tablet Commonly known as:  ANTIVERT Take 1 tablet (25 mg total) by mouth every 4 (four) hours as needed for dizziness.   TURMERIC PO Take 1,000 mg by mouth 2 (two) times daily.   valACYclovir 1000 MG tablet Commonly known as:  VALTREX Take 1 tablet (1,000 mg total) by mouth 3 (three) times daily as needed.   zolmitriptan 5 MG tablet Commonly known as:  ZOMIG Take 1 tablet (5 mg total) by mouth as needed for migraine.   zolpidem 10 MG tablet Commonly known as:  AMBIEN TAKE 2 TABLETS BY MOUTH AT BEDTIME       Allergies:  Allergies  Allergen Reactions  . Oxycodone Rash    Family History: No family history on file.  Social History:  reports that she has never smoked. She has never used smokeless tobacco. She reports current alcohol use. She reports that she does not use drugs.  ROS:                                        Physical Exam: There were no vitals taken for this visit.  Constitutional:  Alert and oriented, No acute distress. HEENT: [place] AT, moist mucus membranes.  Trachea midline, no masses. Cardiovascular: No clubbing, cyanosis, or edema. Respiratory: Normal respiratory effort, no increased work of breathing. GI: Abdomen is soft, nontender, nondistended, no abdominal masses GU: No CVA tenderness.  Levator muscles and bladder not particularly tender and no vaginitis Skin: No rashes,  bruises or suspicious lesions. Lymph: No cervical or inguinal adenopathy. Neurologic: Grossly intact, no focal deficits, moving all 4 extremities. Psychiatric: Normal mood and affect.  Laboratory Data: Lab Results  Component Value Date   WBC 3.8 (L) 11/05/2017   HGB 12.7 11/05/2017   HCT 38.2 11/05/2017   MCV 93.4 11/05/2017   PLT 190.0 11/05/2017    Lab Results  Component Value Date   CREATININE 0.62 11/05/2017    No results found for: PSA  No results found for: TESTOSTERONE  No results found for: HGBA1C  Urinalysis    Component Value Date/Time   BILIRUBINUR negative 11/05/2017 1758   PROTEINUR negative 11/05/2017 1758   UROBILINOGEN 0.2 11/05/2017 1758   NITRITE negative 11/05/2017 1758   LEUKOCYTESUR Negative 11/05/2017 1758    Pertinent Imaging:   Assessment & Plan: Clinically the patient is interstitial cystitis.  She has a distant history of rescue treatments working quite quickly.  We talked about the cocktails.  They usually irritate initially.  She understands I do not give chronic narcotics and she did not want it.  I do not give intramuscular Toradol.  She will stay on the other medications prescribed above until things settle down and then stop them.  She will have the rescue treatments every 2 or 3 days until they settle.  I will check on her in about 3 months unless she does not get better sooner.  Urine was sent but look normal  There are no diagnoses linked to this encounter.  No follow-ups on file.  Martina SinnerScott A Nahara Dona, MD  [facility] [address], [address] [place], [place] [address] [phone]

## 2018-10-28 LAB — CULTURE, URINE COMPREHENSIVE

## 2018-10-29 ENCOUNTER — Other Ambulatory Visit: Payer: Self-pay | Admitting: Urology

## 2018-10-29 ENCOUNTER — Telehealth: Payer: Self-pay | Admitting: Urology

## 2018-10-29 MED ORDER — CIPROFLOXACIN HCL 250 MG PO TABS: 250.0000 mg | ORAL_TABLET | Freq: Two times a day (BID) | ORAL | 0 refills | Status: DC

## 2018-10-29 NOTE — Telephone Encounter (Signed)
-----   Message from Alfredo MartinezScott MacDiarmid, MD sent at 10/29/2018  8:15 AM EST -----  Please call patient and tell her she had a positive urine culture which could be the cause of her symptoms.  Call in ciprofloxacin 250 mg twice a day for 1 week.  I would hold off on bladder rescue treatments until she has had the antibiotic for approximately 5 days or longer.  Antibiotic itself may be her best treatment.  If she still has symptoms following this she can go back on the rescue treatments         ----- Message ----- From: Honor LohGarrison, Carrie M, CMA Sent: 10/29/2018   8:06 AM EST To: Alfredo MartinezScott MacDiarmid, MD   ----- Message ----- From: Interface, [facility] Lab Results In Sent: 10/28/2018   7:37 AM EST To: [facility]

## 2018-10-29 NOTE — Telephone Encounter (Signed)
LMOM for patient to return call.

## 2018-10-29 NOTE — Telephone Encounter (Signed)
Pt LMOM and said she was returning call from someone, they didn't say who, for results and an RX.  Please call pt [phone]

## 2018-10-29 NOTE — Telephone Encounter (Signed)
Pt returns call, informed pt of the information below. RX sent in.

## 2018-10-29 NOTE — Telephone Encounter (Signed)
Please see other telephone encounter from today. This has been taken care of.

## 2018-10-29 NOTE — Telephone Encounter (Signed)
Pt lmom stating she would like to know if Dr. Sherron Monday would give her a Rx for compound vaginal suppositories with diazepam in it for her IC pain. States she has had this before from another Dr and it seemed to help her. Please advise pt at [phone], pt states to please leave a detail message if she's unable to answer. Thanks

## 2018-11-02 ENCOUNTER — Ambulatory Visit (INDEPENDENT_AMBULATORY_CARE_PROVIDER_SITE_OTHER): Payer: 59 | Admitting: Family Medicine

## 2018-11-02 ENCOUNTER — Encounter: Payer: Self-pay | Admitting: Family Medicine

## 2018-11-02 ENCOUNTER — Ambulatory Visit: Payer: BLUE CROSS/BLUE SHIELD | Admitting: Urology

## 2018-11-02 VITALS — BP 103/71 | HR 80 | Temp 97.5°F | Ht 66.0 in | Wt 134.0 lb

## 2018-11-02 DIAGNOSIS — F331 Major depressive disorder, recurrent, moderate: Secondary | ICD-10-CM

## 2018-11-02 DIAGNOSIS — F900 Attention-deficit hyperactivity disorder, predominantly inattentive type: Secondary | ICD-10-CM

## 2018-11-02 DIAGNOSIS — F411 Generalized anxiety disorder: Secondary | ICD-10-CM | POA: Diagnosis not present

## 2018-11-02 DIAGNOSIS — F5101 Primary insomnia: Secondary | ICD-10-CM | POA: Diagnosis not present

## 2018-11-02 DIAGNOSIS — G43809 Other migraine, not intractable, without status migrainosus: Secondary | ICD-10-CM

## 2018-11-02 NOTE — Assessment & Plan Note (Signed)
Did well on lexapro, but gained weight. Would continue trying it again. Call with any concerns. Recheck at follow up.

## 2018-11-02 NOTE — Assessment & Plan Note (Signed)
Stable on ambien for many years. Discussed that we will not prescribe 20mg  of ambien. We do not prescribe any controlled substances on the first visit. Return As needed. Call with any concerns.

## 2018-11-02 NOTE — Assessment & Plan Note (Signed)
Following with neurology. Will get her into PT for vestibular rehab- order given today. Call with any concerns.

## 2018-11-02 NOTE — Assessment & Plan Note (Signed)
Did well on lexapro, but gained weight. Would continue trying it again. Call with any concerns. Recheck at follow up.  

## 2018-11-02 NOTE — Assessment & Plan Note (Signed)
Discussed that we do not prescribe controlled substances on the first visit. Doing OK on medicine. Records from previous PCP reviewed. Return for physical and we can start writing her vyvanse.

## 2018-11-02 NOTE — Progress Notes (Signed)
BP 103/71   Pulse 80   Temp (!) 97.5 F (36.4 C) (Oral)   Ht 5\' 6"  (1.676 m)   Wt 134 lb (60.8 kg)   LMP 10/25/2018   SpO2 95%   BMI 21.63 kg/m    Subjective:    Patient ID: Martha [name], female    DOB: 10-Dec-1970, 48 y.o.   MRN: [id number]  HPI: Martha [name] is a 48 y.o. female who presents today to establish care.  Chief Complaint  Patient presents with  . Establish Care  . Anxiety  . ADHD  . Insomnia   Has been having severe vertigo for about 3.5 months. Has been seeing neurology, ENT and ophthalmologist. She is seeing a neuro-ophthalmologist who thinks that she has vestibular migraines. Has been starting supplements to see how things go. She has been out of work for a month due to her dizziness. She has been getting drops in her eyes. She is going to try some vestibular rehab.   Saw GYN and found that she had some polyps- having polpectomy and D&C on Thursday.   Seeing rheumatology for elevated RF- labs seem to be doing OK. She doesn't think that this is RA.  Had been very healthy except for insomnia and anxiety up until 3.5 months ago.  Known ADHD- has been on vyvanse for a while. Had palpitations with higher doses. Was not taking her vyvanse on the weekends because she didn't have an appetite with it. Has been on ADD medicine since she was a child (12yo). Had been seeing psychiatrist since she was 48yo until she was in her middle 70s. She did really well with the lexapro in the past, but noticed that she gained a lot of weight on lexapro.   ANXIETY/STRESS Duration:exacerbated Anxious mood: yes  Excessive worrying: yes Irritability: no  Sweating: no Nausea: no Palpitations:no Hyperventilation: no Panic attacks: no Agoraphobia: no  Obscessions/compulsions: no Depressed mood: yes Depression screen PHQ 2/9 11/02/2018  Decreased Interest 0  Down, Depressed, Hopeless 1  PHQ - 2 Score 1  Altered sleeping 1  Tired, decreased energy 0  Change in appetite 0   Feeling bad or failure about yourself  0  Trouble concentrating 0  Moving slowly or fidgety/restless 0  Suicidal thoughts 0  PHQ-9 Score 2  Difficult doing work/chores Not difficult at all   GAD 7 : Generalized Anxiety Score 11/02/2018  Nervous, Anxious, on Edge 2  Control/stop worrying 2  Worry too much - different things 2  Trouble relaxing 2  Restless 1  Easily annoyed or irritable 2  Afraid - awful might happen 2  Total GAD 7 Score 13  Anxiety Difficulty Somewhat difficult   Anhedonia: no Weight changes: no Insomnia: yes hard to fall asleep  Hypersomnia: no Fatigue/loss of energy: no Feelings of worthlessness: no Feelings of guilt: no Impaired concentration/indecisiveness: no Suicidal ideations: no  Crying spells: no Recent Stressors/Life Changes: yes   Relationship problems: no   Family stress: no     Financial stress: no    Job stress: no    Recent death/loss: no  Feels like she might have breast implant illness- going to have her implants and capsules removed in February  INSOMNIA- has been on [place] for about 15 years. Takes it daily Duration: chronic Satisfied with sleep quality: yes Difficulty falling asleep: no Difficulty staying asleep: no Waking a few hours after sleep onset: no Early morning awakenings: no Daytime hypersomnolence: no Wakes feeling refreshed: no Good sleep  hygiene: no Apnea: no Snoring: no Depressed/anxious mood: yes Recent stress: yes Restless legs/nocturnal leg cramps: no Chronic pain/arthritis: no History of sleep study: no Treatments attempted: melatonin, uinsom, benadryl and ambien    Active Ambulatory Problems    Diagnosis Date Noted  . Anxiety state 12/01/2007  . Depression 12/01/2007  . Attention deficit hyperactivity disorder (ADHD) 12/01/2007  . ALLERGIC RHINITIS 12/01/2007  . Insomnia 02/27/2009  . Migraine headache 11/23/2014   Resolved Ambulatory Problems    Diagnosis Date Noted  . No Resolved Ambulatory  Problems   Past Medical History:  Diagnosis Date  . ADHD (attention deficit hyperactivity disorder)   . Anxiety   . Interstitial cystitis    Past Surgical History:  Procedure Laterality Date  . BREAST SURGERY  1995, 2016  . MEDIAL COLLATERAL LIGAMENT REPAIR, KNEE  2017    Outpatient Encounter Medications as of 11/02/2018  Medication Sig  . cetirizine (ZYRTEC) 10 MG tablet Take 10 mg by mouth 2 (two) times daily.   . ciprofloxacin (CIPRO) 250 MG tablet Take 1 tablet (250 mg total) by mouth 2 (two) times daily.  . Coenzyme Q10 (COQ-10) 100 MG capsule Take 300 mg by mouth daily.  Marland Kitchen. lisdexamfetamine (VYVANSE) 20 MG capsule Take 20 mg by mouth daily.  Marland Kitchen. MAGNESIUM PO Take 450 mg by mouth.  . methocarbamol (ROBAXIN) 500 MG tablet Take 1 tablet (500 mg total) by mouth every 8 (eight) hours as needed for muscle spasms.  Marland Kitchen. oxybutynin (DITROPAN-XL) 10 MG 24 hr tablet Take 1 tablet (10 mg total) by mouth daily.  . Riboflavin 400 MG CAPS Take 400 mg by mouth daily.  . TURMERIC PO Take 1,200 mg by mouth 2 (two) times daily.   . valACYclovir (VALTREX) 1000 MG tablet Take 1 tablet (1,000 mg total) by mouth 3 (three) times daily as needed.  . zolmitriptan (ZOMIG) 5 MG tablet Take 1 tablet (5 mg total) by mouth as needed for migraine.  Marland Kitchen. zolpidem (AMBIEN) 10 MG tablet TAKE 2 TABLETS BY MOUTH AT BEDTIME  . [DISCONTINUED] lisdexamfetamine (VYVANSE) 30 MG capsule Take 1 capsule (30 mg total) by mouth daily.   No facility-administered encounter medications on file as of 11/02/2018.    Allergies  Allergen Reactions  . Oxycodone Rash   Social History   Socioeconomic History  . Marital status: Married    Spouse name: Not on file  . Number of children: Not on file  . Years of education: Not on file  . Highest education level: Not on file  Occupational History  . Not on file  Social Needs  . Financial resource strain: Not on file  . Food insecurity:    Worry: Not on file    Inability: Not on file    . Transportation needs:    Medical: Not on file    Non-medical: Not on file  Tobacco Use  . Smoking status: Never Smoker  . Smokeless tobacco: Never Used  Substance and Sexual Activity  . Alcohol use: Yes    Alcohol/week: 0.0 standard drinks    Comment: occ  . Drug use: No  . Sexual activity: Yes  Lifestyle  . Physical activity:    Days per week: Not on file    Minutes per session: Not on file  . Stress: Not on file  Relationships  . Social connections:    Talks on phone: Not on file    Gets together: Not on file    Attends religious service: Not on file  Active member of club or organization: Not on file    Attends meetings of clubs or organizations: Not on file    Relationship status: Not on file  Other Topics Concern  . Not on file  Social History Narrative  . Not on file   History reviewed. No pertinent family history.  Review of Systems  Constitutional: Negative.   HENT: Negative.   Respiratory: Negative.   Cardiovascular: Negative.   Gastrointestinal: Negative.   Genitourinary: Negative.   Musculoskeletal: Positive for myalgias. Negative for arthralgias, back pain, gait problem, joint swelling, neck pain and neck stiffness.  Skin: Negative.   Neurological: Positive for dizziness and light-headedness. Negative for tremors, seizures, syncope, facial asymmetry, speech difficulty, weakness, numbness and headaches.  Hematological: Negative.   Psychiatric/Behavioral: Positive for decreased concentration and sleep disturbance. Negative for agitation, behavioral problems, confusion, dysphoric mood, hallucinations, self-injury and suicidal ideas. The patient is nervous/anxious. The patient is not hyperactive.     Per HPI unless specifically indicated above     Objective:    BP 103/71   Pulse 80   Temp (!) 97.5 F (36.4 C) (Oral)   Ht 5\' 6"  (1.676 m)   Wt 134 lb (60.8 kg)   LMP 10/25/2018   SpO2 95%   BMI 21.63 kg/m   Wt Readings from Last 3 Encounters:   11/02/18 134 lb (60.8 kg)  10/26/18 137 lb (62.1 kg)  07/21/18 135 lb 8 oz (61.5 kg)    Physical Exam Vitals signs and nursing note reviewed.  Constitutional:      General: She is not in acute distress.    Appearance: Normal appearance. She is not ill-appearing, toxic-appearing or diaphoretic.  HENT:     Head: Normocephalic and atraumatic.     Right Ear: External ear normal.     Left Ear: External ear normal.     Nose: Nose normal.     Mouth/Throat:     Mouth: Mucous membranes are moist.     Pharynx: Oropharynx is clear.  Eyes:     General: No scleral icterus.       Right eye: No discharge.        Left eye: No discharge.     Extraocular Movements: Extraocular movements intact.     Conjunctiva/sclera: Conjunctivae normal.     Pupils: Pupils are equal, round, and reactive to light.  Neck:     Musculoskeletal: Normal range of motion and neck supple.  Cardiovascular:     Rate and Rhythm: Normal rate and regular rhythm.     Pulses: Normal pulses.     Heart sounds: Normal heart sounds. No murmur. No friction rub. No gallop.   Pulmonary:     Effort: Pulmonary effort is normal. No respiratory distress.     Breath sounds: Normal breath sounds. No stridor. No wheezing, rhonchi or rales.  Chest:     Chest wall: No tenderness.  Musculoskeletal: Normal range of motion.  Skin:    General: Skin is warm and dry.     Capillary Refill: Capillary refill takes less than 2 seconds.     Coloration: Skin is not jaundiced or pale.     Findings: No bruising, erythema, lesion or rash.  Neurological:     General: No focal deficit present.     Mental Status: She is alert and oriented to person, place, and time. Mental status is at baseline.  Psychiatric:        Mood and Affect: Mood normal.  Behavior: Behavior normal.        Thought Content: Thought content normal.        Judgment: Judgment normal.     Results for orders placed or performed in visit on 11/02/18  CBC and differential   Result Value Ref Range   Hemoglobin 13.1 12.0 - 16.0   HCT 38 36 - 46   Platelets 209 150 - 399   WBC 8.0   Basic metabolic panel  Result Value Ref Range   Glucose 92    BUN 8 4 - 21   Creatinine 0.7 0.5 - 1.1   Potassium 3.7 3.4 - 5.3   Sodium 140 137 - 147  Hepatic function panel  Result Value Ref Range   Alkaline Phosphatase 41 25 - 125   ALT 12 7 - 35   AST 20 13 - 35   Bilirubin, Total 0.6   Vitamin B12  Result Value Ref Range   Vitamin B-12 567   TSH  Result Value Ref Range   TSH 1.57 0.41 - 5.90      Assessment & Plan:   Problem List Items Addressed This Visit      Cardiovascular and Mediastinum   Migraine headache    Following with neurology. Will get her into PT for vestibular rehab- order given today. Call with any concerns.         Other   Anxiety state    Did well on lexapro, but gained weight. Would continue trying it again. Call with any concerns. Recheck at follow up.       Depression    Did well on lexapro, but gained weight. Would continue trying it again. Call with any concerns. Recheck at follow up.       Attention deficit hyperactivity disorder (ADHD)    Discussed that we do not prescribe controlled substances on the first visit. Doing OK on medicine. Records from previous PCP reviewed. Return for physical and we can start writing her vyvanse.       Insomnia - Primary    Stable on ambien for many years. Discussed that we will not prescribe 20mg  of ambien. We do not prescribe any controlled substances on the first visit. Return As needed. Call with any concerns.           Follow up plan: Return After 11/06/18- physical, for and follow up ADD/insomnia/anxiety.

## 2018-11-05 HISTORY — PX: OTHER SURGICAL HISTORY: SHX169

## 2018-11-17 ENCOUNTER — Encounter: Payer: 59 | Admitting: Family Medicine

## 2018-12-08 ENCOUNTER — Encounter: Payer: Self-pay | Admitting: Family Medicine

## 2018-12-08 ENCOUNTER — Other Ambulatory Visit: Payer: Self-pay

## 2018-12-08 ENCOUNTER — Ambulatory Visit (INDEPENDENT_AMBULATORY_CARE_PROVIDER_SITE_OTHER): Payer: 59 | Admitting: Family Medicine

## 2018-12-08 VITALS — BP 110/73 | HR 69 | Temp 98.1°F | Ht 66.0 in | Wt 134.0 lb

## 2018-12-08 DIAGNOSIS — Z79899 Other long term (current) drug therapy: Secondary | ICD-10-CM | POA: Insufficient documentation

## 2018-12-08 DIAGNOSIS — F5101 Primary insomnia: Secondary | ICD-10-CM

## 2018-12-08 DIAGNOSIS — Z0000 Encounter for general adult medical examination without abnormal findings: Secondary | ICD-10-CM

## 2018-12-08 DIAGNOSIS — F411 Generalized anxiety disorder: Secondary | ICD-10-CM

## 2018-12-08 DIAGNOSIS — F900 Attention-deficit hyperactivity disorder, predominantly inattentive type: Secondary | ICD-10-CM

## 2018-12-08 DIAGNOSIS — F331 Major depressive disorder, recurrent, moderate: Secondary | ICD-10-CM

## 2018-12-08 MED ORDER — ZOLPIDEM TARTRATE 10 MG PO TABS: 10.0000 mg | ORAL_TABLET | Freq: Every day | ORAL | 2 refills | Status: DC

## 2018-12-08 MED ORDER — LISDEXAMFETAMINE DIMESYLATE 20 MG PO CAPS: 20.0000 mg | ORAL_CAPSULE | Freq: Every day | ORAL | 0 refills | Status: DC

## 2018-12-08 MED ORDER — ESCITALOPRAM OXALATE 5 MG PO TABS: 2.5000 mg | ORAL_TABLET | Freq: Every day | ORAL | 3 refills | Status: DC

## 2018-12-08 NOTE — Assessment & Plan Note (Signed)
Stable on lower dose. Feeling well. 3 month supply given today. Call with any concerns. Recheck 3 months.

## 2018-12-08 NOTE — Patient Instructions (Signed)

## 2018-12-08 NOTE — Assessment & Plan Note (Signed)
For ambien and vyvanse. See scanned documents.

## 2018-12-08 NOTE — Progress Notes (Signed)
BP 110/73   Pulse 69   Temp 98.1 F (36.7 C) (Oral)   Ht 5\' 6"  (1.676 m)   Wt 134 lb (60.8 kg)   SpO2 100%   BMI 21.63 kg/m    Subjective:    Patient ID: Martha [name], female    DOB: 03-Jul-1971, 48 y.o.   MRN: [id number]  HPI: Martha [name] is a 48 y.o. female presenting on 12/08/2018 for comprehensive medical examination. Current medical complaints include:  ADHD FOLLOW UP ADHD status: stable Satisfied with current therapy: yes Medication compliance:  excellent compliance Controlled substance contract: yes Previous psychiatry evaluation: yes Previous medications: yes    Taking meds on weekends/vacations: no Work/school performance:  excellent Difficulty sustaining attention/completing tasks: no Distracted by extraneous stimuli: no Does not listen when spoken to: no  Fidgets with hands or feet: no Unable to stay in seat: no Blurts out/interrupts others: no ADHD Medication Side Effects: no    Decreased appetite: no    Headache: no    Sleeping disturbance pattern: no    Irritability: no    Rebound effects (worse than baseline) off medication: no    Anxiousness: no    Dizziness: no    Tics: no  INSOMNIA- has previously done well on 20mg  ambien, has taken it daily for about 15 years.  Duration: chronic Satisfied with sleep quality: yes Difficulty falling asleep: no Difficulty staying asleep: no Waking a few hours after sleep onset: no Early morning awakenings: no Daytime hypersomnolence: no Wakes feeling refreshed: yes Good sleep hygiene: no Apnea: no Snoring: no Depressed/anxious mood: yes Recent stress: yes Restless legs/nocturnal leg cramps: no Chronic pain/arthritis: no History of sleep study: no Treatments attempted: melatonin, uinsom, benadryl and ambien   ANXIETY/STRESS- has not restarted her lexapro yet. Just had her implants removed, has had her anxiety get a little worse.  Duration:exacerbated Anxious mood: yes  Excessive worrying:  yes Irritability: no  Sweating: no Nausea: no Palpitations:no Hyperventilation: no Panic attacks: no Agoraphobia: no  Obscessions/compulsions: no Depressed mood: no Depression screen [facility] 2/9 12/08/2018 11/02/2018  Decreased Interest 0 0  Down, Depressed, Hopeless 0 1  PHQ - 2 Score 0 1  Altered sleeping 0 1  Tired, decreased energy 0 0  Change in appetite 0 0  Feeling bad or failure about yourself  0 0  Trouble concentrating 0 0  Moving slowly or fidgety/restless 0 0  Suicidal thoughts 0 0  PHQ-9 Score 0 2  Difficult doing work/chores Not difficult at all Not difficult at all   Anhedonia: no Weight changes: no Insomnia: yes hard to fall asleep  Hypersomnia: no Fatigue/loss of energy: yes Feelings of worthlessness: no Feelings of guilt: no Impaired concentration/indecisiveness: no Suicidal ideations: no  Crying spells: no Recent Stressors/Life Changes: no   Relationship problems: no   Family stress: no     Financial stress: no    Job stress: no    Recent death/loss: no  She currently lives with: husband Menopausal Symptoms: no  Depression Screen done today and results listed below:  Depression screen [facility] 2/9 12/08/2018 11/02/2018  Decreased Interest 0 0  Down, Depressed, Hopeless 0 1  PHQ - 2 Score 0 1  Altered sleeping 0 1  Tired, decreased energy 0 0  Change in appetite 0 0  Feeling bad or failure about yourself  0 0  Trouble concentrating 0 0  Moving slowly or fidgety/restless 0 0  Suicidal thoughts 0 0  PHQ-9 Score 0 2  Difficult  doing work/chores Not difficult at all Not difficult at all    Past Medical History:  Past Medical History:  Diagnosis Date  . ADHD (attention deficit hyperactivity disorder)   . Anxiety   . Depression   . Insomnia   . Interstitial cystitis    sees Dr. Patsi Sears   . Migraine headache     Surgical History:  Past Surgical History:  Procedure Laterality Date  . BREAST SURGERY  1995, 2016  . D&C  11/05/2018   Polyps  removed - pathology okay  . Explant capsulectomy    . MEDIAL COLLATERAL LIGAMENT REPAIR, KNEE  2017    Medications:  Current Outpatient Medications on File Prior to Visit  Medication Sig  . cetirizine (ZYRTEC) 10 MG tablet Take 10 mg by mouth 2 (two) times daily.   . Coenzyme Q10 (COQ-10) 100 MG capsule Take 300 mg by mouth daily.  Marland Kitchen MAGNESIUM PO Take 450 mg by mouth.  . methocarbamol (ROBAXIN) 500 MG tablet Take 1 tablet (500 mg total) by mouth every 8 (eight) hours as needed for muscle spasms.  . Riboflavin 400 MG CAPS Take 400 mg by mouth daily.  . TURMERIC PO Take 1,200 mg by mouth 2 (two) times daily.   . valACYclovir (VALTREX) 1000 MG tablet Take 1 tablet (1,000 mg total) by mouth 3 (three) times daily as needed.  . zolmitriptan (ZOMIG) 5 MG tablet Take 1 tablet (5 mg total) by mouth as needed for migraine.   No current facility-administered medications on file prior to visit.     Allergies:  Allergies  Allergen Reactions  . Oxycodone Rash    Social History:  Social History   Socioeconomic History  . Marital status: Married    Spouse name: Not on file  . Number of children: Not on file  . Years of education: Not on file  . Highest education level: Not on file  Occupational History  . Not on file  Social Needs  . Financial resource strain: Not on file  . Food insecurity:    Worry: Not on file    Inability: Not on file  . Transportation needs:    Medical: Not on file    Non-medical: Not on file  Tobacco Use  . Smoking status: Never Smoker  . Smokeless tobacco: Never Used  Substance and Sexual Activity  . Alcohol use: Yes    Alcohol/week: 0.0 standard drinks    Comment: occ  . Drug use: No  . Sexual activity: Yes  Lifestyle  . Physical activity:    Days per week: Not on file    Minutes per session: Not on file  . Stress: Not on file  Relationships  . Social connections:    Talks on phone: Not on file    Gets together: Not on file    Attends religious  service: Not on file    Active member of club or organization: Not on file    Attends meetings of clubs or organizations: Not on file    Relationship status: Not on file  . Intimate partner violence:    Fear of current or ex partner: Not on file    Emotionally abused: Not on file    Physically abused: Not on file    Forced sexual activity: Not on file  Other Topics Concern  . Not on file  Social History Narrative  . Not on file   Social History   Tobacco Use  Smoking Status Never Smoker  Smokeless Tobacco  Never Used   Social History   Substance and Sexual Activity  Alcohol Use Yes  . Alcohol/week: 0.0 standard drinks   Comment: occ    Family History:  Family History  Problem Relation Age of Onset  . Prostate cancer Father     Past medical history, surgical history, medications, allergies, family history and social history reviewed with patient today and changes made to appropriate areas of the chart.   Review of Systems  Constitutional: Negative.   HENT: Negative.   Eyes: Positive for blurred vision. Negative for double vision, photophobia, pain, discharge and redness.  Respiratory: Negative.   Cardiovascular: Negative.   Gastrointestinal: Negative.   Genitourinary: Negative.   Musculoskeletal: Negative.   Skin: Negative.   Neurological: Positive for dizziness. Negative for tingling, tremors, sensory change, speech change, focal weakness, seizures, loss of consciousness, weakness and headaches.  Endo/Heme/Allergies: Positive for polydipsia.  Psychiatric/Behavioral: Negative for depression, hallucinations, memory loss, substance abuse and suicidal ideas. The patient is nervous/anxious and has insomnia.     All other ROS negative except what is listed above and in the HPI.      Objective:    BP 110/73   Pulse 69   Temp 98.1 F (36.7 C) (Oral)   Ht 5\' 6"  (1.676 m)   Wt 134 lb (60.8 kg)   SpO2 100%   BMI 21.63 kg/m   Wt Readings from Last 3 Encounters:   12/08/18 134 lb (60.8 kg)  11/02/18 134 lb (60.8 kg)  10/26/18 137 lb (62.1 kg)    Physical Exam Vitals signs and nursing note reviewed.  Constitutional:      General: She is not in acute distress.    Appearance: Normal appearance. She is not ill-appearing, toxic-appearing or diaphoretic.  HENT:     Head: Normocephalic and atraumatic.     Right Ear: Tympanic membrane, ear canal and external ear normal. There is no impacted cerumen.     Left Ear: Tympanic membrane, ear canal and external ear normal. There is no impacted cerumen.     Nose: Nose normal. No congestion or rhinorrhea.     Mouth/Throat:     Mouth: Mucous membranes are moist.     Pharynx: Oropharynx is clear. No oropharyngeal exudate or posterior oropharyngeal erythema.  Eyes:     General: No scleral icterus.       Right eye: No discharge.        Left eye: No discharge.     Extraocular Movements: Extraocular movements intact.     Conjunctiva/sclera: Conjunctivae normal.     Pupils: Pupils are equal, round, and reactive to light.  Neck:     Musculoskeletal: Normal range of motion and neck supple. No neck rigidity or muscular tenderness.     Vascular: No carotid bruit.  Cardiovascular:     Rate and Rhythm: Normal rate and regular rhythm.     Pulses: Normal pulses.     Heart sounds: No murmur. No friction rub. No gallop.   Pulmonary:     Effort: Pulmonary effort is normal. No respiratory distress.     Breath sounds: Normal breath sounds. No stridor. No wheezing, rhonchi or rales.  Chest:     Chest wall: No tenderness.  Abdominal:     General: Abdomen is flat. Bowel sounds are normal. There is no distension.     Palpations: Abdomen is soft. There is no mass.     Tenderness: There is no abdominal tenderness. There is no right CVA tenderness, left CVA  tenderness, guarding or rebound.     Hernia: No hernia is present.  Genitourinary:    Comments: Breast and pelvic exams deferred with shared decision making, recent breast  surgery- healing well- following up with surgeon on Friday Musculoskeletal:        General: No swelling, tenderness, deformity or signs of injury.     Right lower leg: No edema.     Left lower leg: No edema.  Lymphadenopathy:     Cervical: No cervical adenopathy.  Skin:    General: Skin is warm and dry.     Capillary Refill: Capillary refill takes less than 2 seconds.     Coloration: Skin is not jaundiced or pale.     Findings: No bruising, erythema, lesion or rash.  Neurological:     General: No focal deficit present.     Mental Status: She is alert and oriented to person, place, and time. Mental status is at baseline.     Cranial Nerves: No cranial nerve deficit.     Sensory: No sensory deficit.     Motor: No weakness.     Coordination: Coordination normal.     Gait: Gait normal.     Deep Tendon Reflexes: Reflexes normal.  Psychiatric:        Mood and Affect: Mood normal.        Behavior: Behavior normal.        Thought Content: Thought content normal.        Judgment: Judgment normal.     Results for orders placed or performed in visit on 11/02/18  CBC and differential  Result Value Ref Range   Hemoglobin 13.1 12.0 - 16.0   HCT 38 36 - 46   Platelets 209 150 - 399   WBC 8.0   Basic metabolic panel  Result Value Ref Range   Glucose 92    BUN 8 4 - 21   Creatinine 0.7 0.5 - 1.1   Potassium 3.7 3.4 - 5.3   Sodium 140 137 - 147  Hepatic function panel  Result Value Ref Range   Alkaline Phosphatase 41 25 - 125   ALT 12 7 - 35   AST 20 13 - 35   Bilirubin, Total 0.6   Vitamin B12  Result Value Ref Range   Vitamin B-12 567   TSH  Result Value Ref Range   TSH 1.57 0.41 - 5.90      Assessment & Plan:   Problem List Items Addressed This Visit      Other   Anxiety state    Slightly exacerbated. Will start lexapro. Call with any concerns. Continue to monitor.       Relevant Medications   escitalopram (LEXAPRO) 5 MG tablet   Depression    Slightly  exacerbated. Will start lexapro. Call with any concerns. Continue to monitor.       Relevant Medications   escitalopram (LEXAPRO) 5 MG tablet   Attention deficit hyperactivity disorder (ADHD)    Stable on lower dose. Feeling well. 3 month supply given today. Call with any concerns. Recheck 3 months.       Insomnia    Has been on ambien for about 15 years. Discussed that I will not prescribe 20mg  of ambien, but will prescribe 10. Rx for 3 months given today. Call with any concerns.       Controlled substance agreement signed    For ambien and vyvanse. See scanned documents.       Other Visit  Diagnoses    Routine general medical examination at a health care facility    -  Primary   Vaccines up to date. Screening labs checked today. Pap UTD. Mammo through [facility]. Continue diet and exercise. Call with any concerns.    Relevant Orders   Lipid Panel w/o Chol/HDL Ratio       Follow up plan: Return in about 3 months (around 03/08/2019) for follow up insomnia and ADD.   LABORATORY TESTING:  - Pap smear: up to date  IMMUNIZATIONS:   - Tdap: Tetanus vaccination status reviewed: last tetanus booster within 10 years. - Influenza: Up to date - Pneumovax: Not applicable  SCREENING: -Mammogram: Order in   PATIENT COUNSELING:   Advised to take 1 mg of folate supplement per day if capable of pregnancy.   Sexuality: Discussed sexually transmitted diseases, partner selection, use of condoms, avoidance of unintended pregnancy  and contraceptive alternatives.   Advised to avoid cigarette smoking.  I discussed with the patient that most people either abstain from alcohol or drink within safe limits (<=14/week and <=4 drinks/occasion for males, <=7/weeks and <= 3 drinks/occasion for females) and that the risk for alcohol disorders and other health effects rises proportionally with the number of drinks per week and how often a drinker exceeds daily limits.  Discussed cessation/primary  prevention of drug use and availability of treatment for abuse.   Diet: Encouraged to adjust caloric intake to maintain  or achieve ideal body weight, to reduce intake of dietary saturated fat and total fat, to limit sodium intake by avoiding high sodium foods and not adding table salt, and to maintain adequate dietary potassium and calcium preferably from fresh fruits, vegetables, and low-fat dairy products.    stressed the importance of regular exercise  Injury prevention: Discussed safety belts, safety helmets, smoke detector, smoking near bedding or upholstery.   Dental health: Discussed importance of regular tooth brushing, flossing, and dental visits.    NEXT PREVENTATIVE PHYSICAL DUE IN 1 YEAR. Return in about 3 months (around 03/08/2019) for follow up insomnia and ADD.

## 2018-12-08 NOTE — Assessment & Plan Note (Signed)
Has been on ambien for about 15 years. Discussed that I will not prescribe 20mg  of ambien, but will prescribe 10. Rx for 3 months given today. Call with any concerns.

## 2018-12-08 NOTE — Assessment & Plan Note (Signed)
Slightly exacerbated. Will start lexapro. Call with any concerns. Continue to monitor.  

## 2018-12-08 NOTE — Assessment & Plan Note (Signed)
Slightly exacerbated. Will start lexapro. Call with any concerns. Continue to monitor.

## 2018-12-30 ENCOUNTER — Other Ambulatory Visit: Payer: Self-pay | Admitting: Family Medicine

## 2019-01-13 ENCOUNTER — Encounter: Payer: Self-pay | Admitting: Family Medicine

## 2019-01-13 ENCOUNTER — Ambulatory Visit (INDEPENDENT_AMBULATORY_CARE_PROVIDER_SITE_OTHER): Payer: 59 | Admitting: Family Medicine

## 2019-01-13 ENCOUNTER — Other Ambulatory Visit: Payer: Self-pay

## 2019-01-13 VITALS — BP 139/85 | HR 76 | Temp 99.0°F | Ht 66.0 in | Wt 135.0 lb

## 2019-01-13 DIAGNOSIS — J029 Acute pharyngitis, unspecified: Secondary | ICD-10-CM | POA: Diagnosis not present

## 2019-01-13 DIAGNOSIS — R05 Cough: Secondary | ICD-10-CM

## 2019-01-13 DIAGNOSIS — R059 Cough, unspecified: Secondary | ICD-10-CM

## 2019-01-13 DIAGNOSIS — R52 Pain, unspecified: Secondary | ICD-10-CM

## 2019-01-13 NOTE — Progress Notes (Signed)
BP 139/85   Pulse 76   Temp 99 F (37.2 C) (Oral)   Ht 5\' 6"  (1.676 m)   Wt 135 lb (61.2 kg)   SpO2 99%   BMI 21.79 kg/m    Subjective:    Patient ID: Martha [name], female    DOB: 1971-06-21, 48 y.o.   MRN: [id number]  HPI: Martha [name] is a 48 y.o. female  Chief Complaint  Patient presents with  . Sore Throat    x 2 days  . Cough  . Nasal Congestion  . Shortness of Breath    for the last hour  . Generalized Body Aches   Here today for 2 days of sore throat, congestion, body aches, fatigue, low grade fevers, cough and now the past hour or so shortness of breath. 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 Denies N/V/D, ear pain, hx of pulmonary dz. Trying OTC cold and flu medicines without relief. Would like to get tested for COVID-19 as she works with critically ill patients daily.   Relevant past medical, surgical, family and social history reviewed and updated as indicated. Interim medical history since our last visit reviewed. Allergies and medications reviewed and updated.  Review of Systems  Per HPI unless specifically indicated above     Objective:    BP 139/85   Pulse 76   Temp 99 F (37.2 C) (Oral)   Ht 5\' 6"  (1.676 m)   Wt 135 lb (61.2 kg)   SpO2 99%   BMI 21.79 kg/m   Wt Readings from Last 3 Encounters:  01/13/19 135 lb (61.2 kg)  12/08/18 134 lb (60.8 kg)  11/02/18 134 lb (60.8 kg)    Physical Exam Vitals signs and nursing note reviewed.  Constitutional:      Appearance: Normal appearance. She is ill-appearing.  HENT:     Head: Atraumatic.     Right Ear: Tympanic membrane and external ear normal.     Left Ear: Tympanic membrane and external ear normal.     Nose: Congestion present.     Mouth/Throat:     Mouth: Mucous membranes are moist.     Pharynx: Posterior oropharyngeal erythema present.  Eyes:     Extraocular Movements: Extraocular  movements intact.     Conjunctiva/sclera: Conjunctivae normal.  Neck:     Musculoskeletal: Normal range of motion and neck supple.  Cardiovascular:     Rate and Rhythm: Normal rate and regular rhythm.     Heart sounds: Normal heart sounds.  Pulmonary:     Effort: Pulmonary effort is normal.     Breath sounds: Normal breath sounds. No wheezing.  Musculoskeletal: Normal range of motion.  Skin:    General: Skin is warm and dry.  Neurological:     Mental Status: She is alert and oriented to person, place, and time.  Psychiatric:        Mood and Affect: Mood normal.        Thought Content: Thought content normal.     Results for orders placed or performed in visit on 01/13/19  Rapid Strep Screen ([facility] ONLY)  Result Value Ref Range   Strep Gp A Ag, IA W/Reflex Negative Negative  Culture, Group A Strep  Result Value Ref Range   Strep A Culture WILL FOLLOW   Veritor Flu A/B Waived  Result Value Ref Range   Influenza  A Negative Negative   Influenza B Negative Negative      Assessment & Plan:   Problem List Items Addressed This Visit    None    Visit Diagnoses    Cough    -  Primary   Relevant Orders   Rapid Strep Screen ([facility] ONLY) (Completed)   Veritor Flu A/B Waived (Completed)   Novel Coronavirus, NAA ([facility])   Body aches       Sore throat         Rapid flu and strep neg, and semi-high risk due to exposures at work. Given consistent sxs, will test for COVID-19 and have pt quarantine until results given. Supportive care and strict return precautions given. Pt agreeable to quarantine until we tell her otherwise. CDC forms signed and scanned into chart.   Follow up plan: Return if symptoms worsen or fail to improve.

## 2019-01-16 LAB — RAPID STREP SCREEN (MED CTR MEBANE ONLY): Strep Gp A Ag, IA W/Reflex: NEGATIVE

## 2019-01-16 LAB — CULTURE, GROUP A STREP: STREP A CULTURE: NEGATIVE

## 2019-01-16 LAB — VERITOR FLU A/B WAIVED
INFLUENZA B: NEGATIVE
Influenza A: NEGATIVE

## 2019-01-18 ENCOUNTER — Telehealth: Payer: Self-pay | Admitting: Family Medicine

## 2019-01-18 NOTE — Telephone Encounter (Signed)
Message relayed to that current tests are negative and that we are still awaiting coronovirus. Verbalized understanding and denied questions.

## 2019-01-18 NOTE — Telephone Encounter (Signed)
Copied from CRM [id number]. Topic: General - Other >> Jan 18, 2019  8:02 AM Percival Spanish wrote:  Pt call to fup on her test and would like a call back

## 2019-01-18 NOTE — Telephone Encounter (Signed)
Available results released to mychart this morning, still waiting on her coronavirus testing to come back hopefully in the next few days

## 2019-01-19 NOTE — Telephone Encounter (Signed)
Patient called to follow up on her test results.

## 2019-01-19 NOTE — Telephone Encounter (Signed)
Pt aware.

## 2019-01-19 NOTE — Telephone Encounter (Signed)
Called [facility]. Results are not back, expected sometime this week.

## 2019-01-19 NOTE — Telephone Encounter (Signed)
Are these results back?

## 2019-01-21 ENCOUNTER — Other Ambulatory Visit: Payer: Self-pay | Admitting: Family Medicine

## 2019-01-21 LAB — NOVEL CORONAVIRUS, NAA: SARS-CoV-2, NAA: NOT DETECTED

## 2019-01-24 ENCOUNTER — Other Ambulatory Visit: Payer: Self-pay | Admitting: Family Medicine

## 2019-04-14 ENCOUNTER — Encounter: Payer: Self-pay | Admitting: Family Medicine

## 2019-04-16 ENCOUNTER — Ambulatory Visit: Payer: Self-pay | Admitting: Family Medicine

## 2019-05-27 ENCOUNTER — Other Ambulatory Visit: Payer: Self-pay | Admitting: Family Medicine

## 2019-05-27 NOTE — Telephone Encounter (Signed)
Forwarding medication refill request to PCP for review. 

## 2019-05-28 NOTE — Telephone Encounter (Signed)
Needs appointment

## 2019-05-28 NOTE — Telephone Encounter (Signed)
Called pt she scheduled appt for 8/21 states that she is almost out of medicine and wanting to know if she can get a refill until the appt. Please advise.

## 2019-05-28 NOTE — Telephone Encounter (Signed)
Called pt and let her know that provider sent refill to the pharmacy. Pt verbalized understanding.

## 2019-06-18 ENCOUNTER — Ambulatory Visit: Payer: 59 | Admitting: Family Medicine

## 2019-06-21 ENCOUNTER — Other Ambulatory Visit: Payer: Self-pay | Admitting: Family Medicine

## 2019-07-07 ENCOUNTER — Other Ambulatory Visit: Payer: Self-pay

## 2019-07-07 ENCOUNTER — Encounter: Payer: Self-pay | Admitting: Family Medicine

## 2019-07-07 ENCOUNTER — Ambulatory Visit (INDEPENDENT_AMBULATORY_CARE_PROVIDER_SITE_OTHER): Payer: 59 | Admitting: Family Medicine

## 2019-07-07 DIAGNOSIS — F331 Major depressive disorder, recurrent, moderate: Secondary | ICD-10-CM

## 2019-07-07 DIAGNOSIS — F900 Attention-deficit hyperactivity disorder, predominantly inattentive type: Secondary | ICD-10-CM | POA: Diagnosis not present

## 2019-07-07 DIAGNOSIS — F5101 Primary insomnia: Secondary | ICD-10-CM

## 2019-07-07 DIAGNOSIS — F411 Generalized anxiety disorder: Secondary | ICD-10-CM

## 2019-07-07 MED ORDER — ZOLPIDEM TARTRATE 10 MG PO TABS: 10.0000 mg | ORAL_TABLET | Freq: Every evening | ORAL | 2 refills | Status: DC | PRN

## 2019-07-07 MED ORDER — LISDEXAMFETAMINE DIMESYLATE 20 MG PO CAPS: 20.0000 mg | ORAL_CAPSULE | Freq: Every day | ORAL | 0 refills | Status: DC

## 2019-07-07 MED ORDER — ESCITALOPRAM OXALATE 10 MG PO TABS: 10.0000 mg | ORAL_TABLET | Freq: Every day | ORAL | 1 refills | Status: DC

## 2019-07-07 NOTE — Assessment & Plan Note (Signed)
Under good control on current regimen. Continue current regimen. Continue to monitor. Call with any concerns. Refills given for 3 months. Follow up 3 months.    

## 2019-07-07 NOTE — Assessment & Plan Note (Signed)
Has been off her medicine and having a lot harder time concentrating. Will restart her medicine. 3 month supply sent to her pharmacy. Call with any concerns. Recheck 3 months.

## 2019-07-07 NOTE — Progress Notes (Signed)
BP 122/70   Pulse 78   Temp 97.8 F (36.6 C)   Wt 140 lb (63.5 kg)   BMI 22.60 kg/m    Subjective:    Patient ID: Martha [name], female    DOB: April 21, 1971, 48 y.o.   MRN: [id number]  HPI: Martha [name] is a 48 y.o. female  Chief Complaint  Patient presents with  . Insomnia  . ADHD  . Anxiety   ANXIETY/DEPRESSION- feels like she has been struggling a bit. She stopped taking it due to weight gain- then started taking it again, has been back on it about 2-3 weeks ago Duration:exacerbated Anxious mood: yes  Excessive worrying: yes Irritability: yes  Sweating: no Nausea: no Palpitations:no Hyperventilation: no Panic attacks: yes Agoraphobia: no  Obscessions/compulsions: no Depressed mood: yes Depression screen [facility] 2/9 07/07/2019 12/08/2018 11/02/2018  Decreased Interest 0 0 0  Down, Depressed, Hopeless 0 0 1  PHQ - 2 Score 0 0 1  Altered sleeping 0 0 1  Tired, decreased energy 0 0 0  Change in appetite 0 0 0  Feeling bad or failure about yourself  0 0 0  Trouble concentrating 3 0 0  Moving slowly or fidgety/restless 2 0 0  Suicidal thoughts 0 0 0  PHQ-9 Score 5 0 2  Difficult doing work/chores Extremely dIfficult Not difficult at all Not difficult at all   Anhedonia: no Weight changes: yes Insomnia: yes hard to fall asleep  Hypersomnia: no Fatigue/loss of energy: yes Feelings of worthlessness: no Feelings of guilt: yes Impaired concentration/indecisiveness: yes Suicidal ideations: no  Crying spells: no Recent Stressors/Life Changes: yes   Relationship problems: no   Family stress: no     Financial stress: yes    Job stress: yes    Recent death/loss: no  ADHD FOLLOW UP- it does not appear that she ever picked up her vyvanse. Last Rx was filled in October of 2019, stopped taking her vyvanse about 6 months ago ADHD status: uncontrolled Satisfied with current therapy: no Medication compliance:  poor compliance Controlled substance contract: no  Previous psychiatry evaluation: no Previous medications: vyvanse    Taking meds on weekends/vacations: no Work/school performance:  fair Difficulty sustaining attention/completing tasks: yes Distracted by extraneous stimuli: yes Does not listen when spoken to: no  Fidgets with hands or feet: no Unable to stay in seat: no Blurts out/interrupts others: no ADHD Medication Side Effects: no    Decreased appetite: no    Headache: no    Sleeping disturbance pattern: no    Irritability: no    Rebound effects (worse than baseline) off medication: no    Anxiousness: no    Dizziness: no    Tics: no  INSOMNIA Duration: chronic Satisfied with sleep quality: yes Difficulty falling asleep: no Difficulty staying asleep: no Waking a few hours after sleep onset: no Early morning awakenings: no Daytime hypersomnolence: no Wakes feeling refreshed: no Good sleep hygiene: yes Apnea: no Snoring: no Depressed/anxious mood: yes Recent stress: yes Restless legs/nocturnal leg cramps: no Chronic pain/arthritis: no History of sleep study: no Treatments attempted: melatonin, uinsom, benadryl and ambien   Relevant past medical, surgical, family and social history reviewed and updated as indicated. Interim medical history since our last visit reviewed. Allergies and medications reviewed and updated.  Review of Systems  Constitutional: Negative.   Respiratory: Negative.   Cardiovascular: Negative.   Musculoskeletal: Negative.   Skin: Negative.   Neurological: Negative.   Psychiatric/Behavioral: Positive for decreased concentration, dysphoric mood and  sleep disturbance. Negative for agitation, behavioral problems, confusion, hallucinations, self-injury and suicidal ideas. The patient is nervous/anxious. The patient is not hyperactive.     Per HPI unless specifically indicated above     Objective:    BP 122/70   Pulse 78   Temp 97.8 F (36.6 C)   Wt 140 lb (63.5 kg)   BMI 22.60 kg/m    Wt Readings from Last 3 Encounters:  07/07/19 140 lb (63.5 kg)  01/13/19 135 lb (61.2 kg)  12/08/18 134 lb (60.8 kg)    Physical Exam Vitals signs and nursing note reviewed.  Constitutional:      General: She is not in acute distress.    Appearance: Normal appearance. She is not ill-appearing, toxic-appearing or diaphoretic.  HENT:     Head: Normocephalic and atraumatic.     Right Ear: External ear normal.     Left Ear: External ear normal.     Nose: Nose normal.     Mouth/Throat:     Mouth: Mucous membranes are moist.     Pharynx: Oropharynx is clear.  Eyes:     General: No scleral icterus.       Right eye: No discharge.        Left eye: No discharge.     Conjunctiva/sclera: Conjunctivae normal.     Pupils: Pupils are equal, round, and reactive to light.  Neck:     Musculoskeletal: Normal range of motion.  Pulmonary:     Effort: Pulmonary effort is normal. No respiratory distress.     Comments: Speaking in full sentences Musculoskeletal: Normal range of motion.  Skin:    Coloration: Skin is not jaundiced or pale.     Findings: No bruising, erythema, lesion or rash.  Neurological:     Mental Status: She is alert and oriented to person, place, and time. Mental status is at baseline.  Psychiatric:        Mood and Affect: Mood normal.        Behavior: Behavior normal.        Thought Content: Thought content normal.        Judgment: Judgment normal.     Results for orders placed or performed in visit on 01/13/19  Rapid Strep Screen ([facility] ONLY)   Specimen: Other   OTHER  Result Value Ref Range   Strep Gp A Ag, IA W/Reflex Negative Negative  Culture, Group A Strep   OTHER  Result Value Ref Range   Strep A Culture Negative   Novel Coronavirus, NAA ([facility])   Specimen: Nasal Swab  Result Value Ref Range   SARS-CoV-2, NAA Not Detected Not Detected  Veritor Flu A/B Waived  Result Value Ref Range   Influenza A Negative Negative   Influenza B Negative  Negative      Assessment & Plan:   Problem List Items Addressed This Visit      Other   Anxiety state    Not under great control. Will increase her lexapro to 10mg  and recheck 3 months. Call with any concerns. Continue to monitor.       Relevant Medications   escitalopram (LEXAPRO) 10 MG tablet   Depression    Not under great control. Will increase her lexapro to 10mg  and recheck 3 months. Call with any concerns. Continue to monitor.       Relevant Medications   escitalopram (LEXAPRO) 10 MG tablet   Attention deficit hyperactivity disorder (ADHD)    Has been off her  medicine and having a lot harder time concentrating. Will restart her medicine. 3 month supply sent to her pharmacy. Call with any concerns. Recheck 3 months.       Insomnia    Under good control on current regimen. Continue current regimen. Continue to monitor. Call with any concerns. Refills given for 3 months. Follow up 3 months.             Follow up plan: Return in about 3 months (around 10/06/2019).   . This visit was completed via FaceTime due to the restrictions of the COVID-19 pandemic. All issues as above were discussed and addressed. Physical exam was done as above through visual confirmation on FaceTime. If it was felt that the patient should be evaluated in the office, they were directed there. The patient verbally consented to this visit. . Location of the patient: work . Location of the provider: home . Those involved with this call:  . Provider: Olevia PerchesMegan , DO . CMA: Tiffany Reel, CMA . Front Desk/Registration: Adela Portshristan Williamson  . Time spent on call: 25 minutes with patient face to face via video conference. More than 50% of this time was spent in counseling and coordination of care. 40 minutes total spent in review of patient's record and preparation of their chart.

## 2019-07-07 NOTE — Assessment & Plan Note (Signed)
Not under great control. Will increase her lexapro to 10mg  and recheck 3 months. Call with any concerns. Continue to monitor.

## 2019-07-07 NOTE — Assessment & Plan Note (Signed)
Not under great control. Will increase her lexapro to 10mg and recheck 3 months. Call with any concerns. Continue to monitor.  

## 2019-07-15 ENCOUNTER — Ambulatory Visit (INDEPENDENT_AMBULATORY_CARE_PROVIDER_SITE_OTHER): Payer: 59 | Admitting: Unknown Physician Specialty

## 2019-07-15 ENCOUNTER — Other Ambulatory Visit: Payer: Self-pay

## 2019-07-15 ENCOUNTER — Telehealth: Payer: Self-pay | Admitting: Family Medicine

## 2019-07-15 DIAGNOSIS — R05 Cough: Secondary | ICD-10-CM

## 2019-07-15 DIAGNOSIS — J0100 Acute maxillary sinusitis, unspecified: Secondary | ICD-10-CM

## 2019-07-15 DIAGNOSIS — R059 Cough, unspecified: Secondary | ICD-10-CM

## 2019-07-15 MED ORDER — BENZONATATE 200 MG PO CAPS: 200.0000 mg | ORAL_CAPSULE | Freq: Two times a day (BID) | ORAL | 0 refills | Status: DC | PRN

## 2019-07-15 MED ORDER — AZITHROMYCIN 250 MG PO TABS: ORAL_TABLET | ORAL | 0 refills | Status: DC

## 2019-07-15 NOTE — Progress Notes (Signed)
   There were no vitals taken for this visit.   Subjective:    Patient ID: Martha [name], female    DOB: Oct 01, 1971, 48 y.o.   MRN: [id number]  HPI: Martha [name] is a 48 y.o. female  Chief Complaint  Patient presents with  . Cough    pt states she has had cold symptoms for the past week but started to have worsening ear pain and productive cough last night    . This visit was completed via telephone due to the restrictions of the COVID-19 pandemic. All issues as above were discussed and addressed but no physical exam was performed. If it was felt that the patient should be evaluated in the office, they were directed there. The patient verbally consented to this visit. Patient was unable to complete an audio/visual visit due to video not working. Due to the catastrophic nature of the COVID-19 pandemic, this visit was done through audio contact only. . Location of the patient: home . Location of the provider: work . Those involved with this call:  . Provider: Kathrine Haddock, DNP . CMA: Yvonna Alanis, CMA . Front Desk/Registration: Jill Side  . Time spent on call: 15 minutes on the phone discussing health concerns. 10 minutes total spent in review of patient's record and preparation of their chart.   Cough This is a new problem. The current episode started in the past 7 days. The problem has been gradually worsening. The problem occurs constantly. The cough is productive of sputum. Associated symptoms include ear congestion, ear pain and rhinorrhea. Pertinent negatives include no chest pain, chills, fever, headaches, heartburn, rash or shortness of breath. Treatments tried: pseudophed. The treatment provided no relief.    Relevant past medical, surgical, family and social history reviewed and updated as indicated. Interim medical history since our last visit reviewed. Allergies and medications reviewed and updated.  Review of Systems  Constitutional: Negative for chills  and fever.  HENT: Positive for ear pain and rhinorrhea.   Respiratory: Positive for cough. Negative for shortness of breath.   Cardiovascular: Negative for chest pain.  Gastrointestinal: Negative for heartburn.  Skin: Negative for rash.  Neurological: Negative for headaches.    Per HPI unless specifically indicated above     Objective:    There were no vitals taken for this visit.  Wt Readings from Last 3 Encounters:  07/07/19 140 lb (63.5 kg)  01/13/19 135 lb (61.2 kg)  12/08/18 134 lb (60.8 kg)    Physical Exam Neurological:     Mental Status: She is alert and oriented to person, place, and time.       Assessment & Plan:   Problem List Items Addressed This Visit    None    Visit Diagnoses    Acute non-recurrent maxillary sinusitis    -  Primary   Rx for Zpack.   Pt ed on peudaphed and gaiffenison    Cough       Will rx Tessalon Perles      Pt ed on inhaler and prednisone if no improvement  Follow up plan: Return if symptoms worsen or fail to improve.

## 2019-07-15 NOTE — Telephone Encounter (Signed)
Pt states at visit today she was told that a Z Pack and Tussalon Pearls would be called into her pharmacy. Pt states that the pharmacy still hasn't received anything. Pt uses  [id number] - [place], [place] - [address] [phone] (Phone) [phone] (Fax)   Pt can be reached at [phone]

## 2019-07-15 NOTE — Telephone Encounter (Signed)
Called pt and let her know that medicine has been sent to the pharmacy, pt verbalized understanding

## 2019-10-09 ENCOUNTER — Other Ambulatory Visit: Payer: Self-pay | Admitting: Family Medicine

## 2019-10-10 NOTE — Telephone Encounter (Signed)
Requested medication (s) are due for refill today: yes  Requested medication (s) are on the active medication list: yes  Last refill:  09/10/2019  Future visit scheduled: no  Notes to clinic:  refill cannot be delegated    Requested Prescriptions  Pending Prescriptions Disp Refills   zolpidem (AMBIEN) 10 MG tablet [Pharmacy Med Name: ZOLPIDEM TARTRATE 10 MG TABLET] 30 tablet 2    Sig: Take 1 tablet (10 mg total) by mouth at bedtime as needed for sleep.      Not Delegated - Psychiatry:  Anxiolytics/Hypnotics Failed - 10/09/2019  9:26 AM      Failed - This refill cannot be delegated      Failed - Urine Drug Screen completed in last 360 days.      Passed - Valid encounter within last 6 months    Recent Outpatient Visits           2 months ago Acute non-recurrent maxillary sinusitis   [facility] Kathrine Haddock, NP   3 months ago Moderate episode of recurrent major depressive disorder [facility])   [place], [place], DO   9 months ago Cough   [place], [place]   10 months ago Routine general medical examination at a health care facility   [facility], [place], DO   11 months ago Primary insomnia   [place], [place], DO

## 2019-10-11 ENCOUNTER — Encounter: Payer: Self-pay | Admitting: Family Medicine

## 2019-10-11 MED ORDER — ZOLPIDEM TARTRATE 10 MG PO TABS: 10.0000 mg | ORAL_TABLET | Freq: Every evening | ORAL | 0 refills | Status: DC | PRN

## 2019-10-11 NOTE — Telephone Encounter (Signed)
Called pt to schedule appt, no answer, left vm, sending letter. °

## 2019-10-11 NOTE — Telephone Encounter (Signed)
Needs appointment

## 2019-10-12 ENCOUNTER — Encounter: Payer: Self-pay | Admitting: Family Medicine

## 2019-10-20 ENCOUNTER — Other Ambulatory Visit: Payer: Self-pay

## 2019-10-20 ENCOUNTER — Encounter: Payer: Self-pay | Admitting: Family Medicine

## 2019-10-20 ENCOUNTER — Other Ambulatory Visit: Payer: Self-pay | Admitting: Family Medicine

## 2019-10-20 ENCOUNTER — Ambulatory Visit (INDEPENDENT_AMBULATORY_CARE_PROVIDER_SITE_OTHER): Payer: 59 | Admitting: Family Medicine

## 2019-10-20 VITALS — BP 129/78 | HR 79 | Ht 67.0 in | Wt 134.0 lb

## 2019-10-20 DIAGNOSIS — F5101 Primary insomnia: Secondary | ICD-10-CM | POA: Diagnosis not present

## 2019-10-20 DIAGNOSIS — R768 Other specified abnormal immunological findings in serum: Secondary | ICD-10-CM

## 2019-10-20 DIAGNOSIS — Z1231 Encounter for screening mammogram for malignant neoplasm of breast: Secondary | ICD-10-CM | POA: Diagnosis not present

## 2019-10-20 NOTE — Progress Notes (Signed)
BP 129/78   Pulse 79   Ht 5\' 7"  (1.702 m)   Wt 134 lb (60.8 kg)   BMI 20.99 kg/m    Subjective:    Patient ID: Martha [name], female    DOB: Oct 10, 1971, 48 y.o.   MRN: [id number]  HPI: Martha [name] is a 48 y.o. female  Chief Complaint  Patient presents with  . Insomnia   Decided not to take the lexapro and vyvanse. Changed her diet and increased her exercise. Doing well.  INSOMNIA Duration: chronic Satisfied with sleep quality: yes Difficulty falling asleep: yes Difficulty staying asleep: no Waking a few hours after sleep onset: no Early morning awakenings: no Daytime hypersomnolence: no Wakes feeling refreshed: no Good sleep hygiene: yes Apnea: no Snoring: no Depressed/anxious mood: yes Recent stress: yes Restless legs/nocturnal leg cramps: no Chronic pain/arthritis: no History of sleep study: yes Treatments attempted: melatonin, uinsom, benadryl and ambien   Relevant past medical, surgical, family and social history reviewed and updated as indicated. Interim medical history since our last visit reviewed. Allergies and medications reviewed and updated.  Review of Systems  Constitutional: Negative.   Respiratory: Negative.   Cardiovascular: Negative.   Musculoskeletal: Negative.   Neurological: Negative.   Psychiatric/Behavioral: Negative.     Per HPI unless specifically indicated above     Objective:    BP 129/78   Pulse 79   Ht 5\' 7"  (1.702 m)   Wt 134 lb (60.8 kg)   BMI 20.99 kg/m   Wt Readings from Last 3 Encounters:  10/20/19 134 lb (60.8 kg)  07/07/19 140 lb (63.5 kg)  01/13/19 135 lb (61.2 kg)    Physical Exam Vitals and nursing note reviewed.  Constitutional:      General: She is not in acute distress.    Appearance: Normal appearance. She is not ill-appearing, toxic-appearing or diaphoretic.  HENT:     Head: Normocephalic and atraumatic.     Right Ear: External ear normal.     Left Ear: External ear normal.     Nose: Nose  normal.     Mouth/Throat:     Mouth: Mucous membranes are moist.     Pharynx: Oropharynx is clear.  Eyes:     General: No scleral icterus.       Right eye: No discharge.        Left eye: No discharge.     Extraocular Movements: Extraocular movements intact.     Conjunctiva/sclera: Conjunctivae normal.     Pupils: Pupils are equal, round, and reactive to light.  Cardiovascular:     Rate and Rhythm: Normal rate and regular rhythm.     Pulses: Normal pulses.     Heart sounds: Normal heart sounds. No murmur. No friction rub. No gallop.   Pulmonary:     Effort: Pulmonary effort is normal. No respiratory distress.     Breath sounds: Normal breath sounds. No stridor. No wheezing, rhonchi or rales.  Chest:     Chest wall: No tenderness.  Musculoskeletal:        General: Normal range of motion.     Cervical back: Normal range of motion and neck supple.  Skin:    General: Skin is warm and dry.     Capillary Refill: Capillary refill takes less than 2 seconds.     Coloration: Skin is not jaundiced or pale.     Findings: No bruising, erythema, lesion or rash.  Neurological:     General: No focal deficit  present.     Mental Status: She is alert and oriented to person, place, and time. Mental status is at baseline.  Psychiatric:        Mood and Affect: Mood normal.        Behavior: Behavior normal.        Thought Content: Thought content normal.        Judgment: Judgment normal.     Results for orders placed or performed in visit on 01/13/19  Rapid Strep Screen ([facility] ONLY)   Specimen: Other   OTHER  Result Value Ref Range   Strep Gp A Ag, IA W/Reflex Negative Negative  Culture, Group A Strep   OTHER  Result Value Ref Range   Strep A Culture Negative   Novel Coronavirus, NAA ([facility])   Specimen: Nasal Swab  Result Value Ref Range   SARS-CoV-2, NAA Not Detected Not Detected  Veritor Flu A/B Waived  Result Value Ref Range   Influenza A Negative Negative   Influenza B  Negative Negative      Assessment & Plan:   Problem List Items Addressed This Visit      Other   Insomnia - Primary    Under good control on current regimen. Continue current regimen. Continue to monitor. Call with any concerns. Refills given for 6 months. Follow up 6 months.         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 Order placed.    Relevant Orders   Rheumatoid Factor   Encounter for screening mammogram for malignant neoplasm of breast       Mammogram ordered today.   Relevant Orders   MM DIGITAL SCREENING BILATERAL       Follow up plan: Return in about 6 months (around 04/19/2020) for Physical.    . This visit was completed via FaceTime due to the restrictions of the COVID-19 pandemic. All issues as above were discussed and addressed. Physical exam was done as above through visual confirmation on FaceTime. If it was felt that the patient should be evaluated in the office, they were directed there. The patient verbally consented to this visit. . Location of the patient: work . Location of the provider: home . Those involved with this call:  . Provider: Olevia Perches, DO . CMA: Tiffany Reel, CMA . Front Desk/Registration: Adela Ports  . Time spent on call: 15 minutes with patient face to face via video conference. More than 50% of this time was spent in counseling and coordination of care. 23 minutes total spent in review of patient's record and preparation of their chart.

## 2019-10-20 NOTE — Telephone Encounter (Signed)
Medication Refill - Medication: zolpidem (AMBIEN) 10 MG tablet   Has the patient contacted their pharmacy? Yes.   (Agent: If no, request that the patient contact the pharmacy for the refill.) (Agent: If yes, when and what did the pharmacy advise?)  Preferred Pharmacy (with phone number or street name):  [id number] [place] - [address]  [address] [place] [address]  Phone: [phone] Fax: [phone]     Agent: Please be advised that RX refills may take up to 3 business days. We ask that you follow-up with your pharmacy.  

## 2019-10-20 NOTE — Assessment & Plan Note (Signed)
Under good control on current regimen. Continue current regimen. Continue to monitor. Call with any concerns. Refills given for 6 months. Follow up 6 months. 

## 2019-10-20 NOTE — Progress Notes (Signed)
LVM to make 6 month physical,sent letter.

## 2019-10-21 ENCOUNTER — Other Ambulatory Visit: Payer: Self-pay | Admitting: Family Medicine

## 2019-10-21 MED ORDER — ZOLPIDEM TARTRATE 10 MG PO TABS: 10.0000 mg | ORAL_TABLET | Freq: Every evening | ORAL | 5 refills | Status: DC | PRN

## 2019-10-21 NOTE — Telephone Encounter (Signed)
Routing to provider  

## 2019-10-21 NOTE — Telephone Encounter (Signed)
Requested medication (s) are due for refill today: no  Requested medication (s) are on the active medication list: yes  Last refill: 10/11/2019  Future visit scheduled: no  Notes to clinic: refill cannot be delegated    Requested Prescriptions  Pending Prescriptions Disp Refills   zolpidem (AMBIEN) 10 MG tablet 10 tablet 0    Sig: Take 1 tablet (10 mg total) by mouth at bedtime as needed for sleep.      Not Delegated - Psychiatry:  Anxiolytics/Hypnotics Failed - 10/21/2019  6:58 AM      Failed - This refill cannot be delegated      Failed - Urine Drug Screen completed in last 360 days.      Passed - Valid encounter within last 6 months    Recent Outpatient Visits           Yesterday Primary insomnia   [facility] [place], Megan P, DO   3 months ago Acute non-recurrent maxillary sinusitis   [facility] Kathrine Haddock, NP   3 months ago Moderate episode of recurrent major depressive disorder [facility])   [place], [place], DO   9 months ago Cough   [place], [place]   10 months ago Routine general medical examination at a health care facility   [facility], [place], [place]

## 2019-10-28 ENCOUNTER — Encounter: Payer: Self-pay | Admitting: Family Medicine

## 2019-11-02 NOTE — Telephone Encounter (Signed)
Order placed in signature folder.

## 2019-11-18 LAB — HM MAMMOGRAPHY

## 2019-12-17 ENCOUNTER — Encounter: Payer: Self-pay | Admitting: Nurse Practitioner

## 2019-12-17 ENCOUNTER — Other Ambulatory Visit: Payer: Self-pay

## 2019-12-17 ENCOUNTER — Ambulatory Visit (INDEPENDENT_AMBULATORY_CARE_PROVIDER_SITE_OTHER): Payer: 59 | Admitting: Nurse Practitioner

## 2019-12-17 ENCOUNTER — Other Ambulatory Visit: Payer: Self-pay | Admitting: Nurse Practitioner

## 2019-12-17 ENCOUNTER — Ambulatory Visit: Payer: 59 | Admitting: Nurse Practitioner

## 2019-12-17 VITALS — BP 101/69 | HR 79 | Temp 98.2°F

## 2019-12-17 DIAGNOSIS — F411 Generalized anxiety disorder: Secondary | ICD-10-CM

## 2019-12-17 DIAGNOSIS — R300 Dysuria: Secondary | ICD-10-CM | POA: Insufficient documentation

## 2019-12-17 MED ORDER — SULFAMETHOXAZOLE-TRIMETHOPRIM 800-160 MG PO TABS: 1.0000 | ORAL_TABLET | Freq: Two times a day (BID) | ORAL | 0 refills | Status: DC

## 2019-12-17 NOTE — Progress Notes (Signed)
BP 101/69   Pulse 79   Temp 98.2 F (36.8 C) (Oral)   SpO2 97%    Subjective:    Patient ID: Martha [name], female    DOB: Jan 03, 1971, 49 y.o.   MRN: [id number]  HPI: Martha [name] is a 49 y.o. female  Chief Complaint  Patient presents with  . Urinary Tract Infection    pt states she had dizziness and fatigue on Tuesday, stated having pain, pressure, and burning with urination on Wednesday   URINARY SYMPTOMS Onset: 2 days ago Dysuria: yes Urinary frequency: no Urgency: no Small volume voids: yes Symptom severity: moderate Urinary incontinence: no Foul odor: yes Hematuria: no Abdominal pain: yes Back pain: yes Suprapubic pain/pressure: yes Flank pain: yes Fever:  no Nausea/Vomiting: yes ; not related to UTI symptoms but anxiousness Diarrhea: no Fatigue: yes Dizziness: yes Relief with cranberry juice: no; not tried  Relief with pyridium: no; not tried Status: worse Previous urinary tract infection: yes Recurrent urinary tract infection: no Sexual activity: monogamous; urinates after sexual activity History of sexually transmitted disease: no Treatments attempted: Ibuprofen  Drinks 1 cup of decaf coffee per day.  Patient reports she used to have dizziness a couple of years ago, she saw a Horticulturist, commercial, was disagnosed with vestibular migraines and was given eye drops.  She reports she had her breast implants removed in January 2020 and the dizzy spells have improved.  ANXIETY/STRESS Duration:uncontrolled Anxious mood: yes  Excessive worrying: yes Irritability: no  Sweating: yes Nausea: yes Palpitations:no Hyperventilation: yes Panic attacks: yes Obscessions/compulsions: no Depressed mood: yes Depression screen [facility] 2/9 12/17/2019 10/20/2019 07/07/2019 12/08/2018 11/02/2018  Decreased Interest 0 0 0 0 0  Down, Depressed, Hopeless 1 0 0 0 1  PHQ - 2 Score 1 0 0 0 1  Altered sleeping 3 1 0 0 1  Tired, decreased energy 1 0 0 0 0  Change in  appetite 0 0 0 0 0  Feeling bad or failure about yourself  0 0 0 0 0  Trouble concentrating 1 1 3  0 0  Moving slowly or fidgety/restless 0 1 2 0 0  Suicidal thoughts 0 0 0 0 0  PHQ-9 Score 6 3 5  0 2  Difficult doing work/chores Somewhat difficult Not difficult at all Extremely dIfficult Not difficult at all Not difficult at all   Anhedonia: no Weight changes: no Insomnia: no hard to stay asleep ; baseline for patient Hypersomnia: no Fatigue/loss of energy: yes Feelings of worthlessness: no Feelings of guilt: no Impaired concentration/indecisiveness: yes Suicidal ideations: no  Crying spells: yes Recent Stressors/Life Changes: yes   Relationship problems: yes   Family stress: no     Financial stress: no    Job stress: yes    Recent death/loss: no   Allergies  Allergen Reactions  . Oxycodone Rash   Outpatient Encounter Medications as of 12/17/2019  Medication Sig  . cetirizine (ZYRTEC) 10 MG tablet Take 10 mg by mouth 2 (two) times daily.   escitalopram (LEXAPRO) 5 MG tablet Take 5 mg by mouth daily.  . Lutein 40 MG CAPS Take by mouth.  12/19/2019 MAGNESIUM PO Take 450 mg by mouth.  . TURMERIC PO Take 1,200 mg by mouth 2 (two) times daily.   Marland Kitchen zolpidem (AMBIEN) 10 MG tablet Take 1 tablet (10 mg total) by mouth at bedtime as needed for sleep.  Marland Kitchen sulfamethoxazole-trimethoprim (BACTRIM DS) 800-160 MG tablet Take 1 tablet by mouth 2 (two) times daily.  Marland Kitchen  zolmitriptan (ZOMIG) 5 MG tablet Take 1 tablet (5 mg total) by mouth as needed for migraine.  . [DISCONTINUED] Coenzyme Q10 (COQ-10) 100 MG capsule Take 300 mg by mouth daily.  . [DISCONTINUED] Riboflavin 400 MG CAPS Take 400 mg by mouth daily.   No facility-administered encounter medications on file as of 12/17/2019.   Patient Active Problem List   Diagnosis Date Noted  . Burning with urination 12/17/2019  . Controlled substance agreement signed 12/08/2018  . Migraine headache 11/23/2014  . Insomnia 02/27/2009  . Anxiety state  12/01/2007  . Depression 12/01/2007  . Attention deficit hyperactivity disorder (ADHD) 12/01/2007  . ALLERGIC RHINITIS 12/01/2007   Past Medical History:  Diagnosis Date  . ADHD (attention deficit hyperactivity disorder)   . Anxiety   . Depression   . Insomnia   . Interstitial cystitis    sees Dr. Patsi Sears   . Migraine headache     Review of Systems  Constitutional: Positive for fatigue. Negative for activity change, appetite change, chills and fever.  Respiratory: Negative.  Negative for shortness of breath and wheezing.   Cardiovascular: Negative for chest pain and palpitations.  Gastrointestinal: Positive for abdominal pain (suprapubic). Negative for diarrhea, nausea and vomiting.  Genitourinary: Positive for decreased urine volume, dysuria, flank pain and pelvic pain. Negative for difficulty urinating, enuresis, frequency, hematuria and urgency.  Skin: Negative.  Negative for rash.  Neurological: Positive for dizziness. Negative for weakness, numbness and headaches.  Psychiatric/Behavioral: Positive for sleep disturbance. Negative for agitation, confusion, decreased concentration, dysphoric mood and suicidal ideas. The patient is nervous/anxious. The patient is not hyperactive.     Per HPI unless specifically indicated above     Objective:    BP 101/69   Pulse 79   Temp 98.2 F (36.8 C) (Oral)   SpO2 97%   Wt Readings from Last 3 Encounters:  10/20/19 134 lb (60.8 kg)  07/07/19 140 lb (63.5 kg)  01/13/19 135 lb (61.2 kg)    Physical Exam Vitals and nursing note reviewed.  Constitutional:      General: She is not in acute distress.    Appearance: Normal appearance. She is normal weight.  HENT:     Head: Normocephalic.     Right Ear: External ear normal.     Left Ear: External ear normal.  Cardiovascular:     Rate and Rhythm: Normal rate and regular rhythm.     Pulses: Normal pulses.     Heart sounds: Normal heart sounds. No murmur.  Pulmonary:     Effort:  Pulmonary effort is normal. No respiratory distress.     Breath sounds: Normal breath sounds. No wheezing.  Abdominal:     General: Abdomen is flat. Bowel sounds are normal. There is no distension.     Palpations: Abdomen is soft.     Tenderness: There is abdominal tenderness. There is right CVA tenderness and left CVA tenderness.  Skin:    General: Skin is warm and dry.     Coloration: Skin is not jaundiced or pale.  Neurological:     General: No focal deficit present.     Mental Status: She is alert and oriented to person, place, and time.     Motor: No weakness.     Gait: Gait normal.  Psychiatric:        Mood and Affect: Mood normal.        Behavior: Behavior normal.        Thought Content: Thought content normal.  Judgment: Judgment normal.       Assessment & Plan:   Problem List Items Addressed This Visit      Other   Anxiety state    Chronic, uncontrolled.  With more recent panic episodes lately.  Started back on Lexapro 5mg  by Neurologist yesterday.  Referral placed for Psychiatry today per patient request.      Relevant Medications   escitalopram (LEXAPRO) 5 MG tablet   Other Relevant Orders   Ambulatory referral to Psychiatry   Burning with urination - Primary    Acute, ongoing.  IC has been stable and diet-controlled for a couple of years.  UA and microscopic negative in clinic today, will send out urine culture.  Given symptoms and history of UTI, will treat empirically with Bactrim bid x 5 days and f/u on culture.       Relevant Medications   sulfamethoxazole-trimethoprim (BACTRIM DS) 800-160 MG tablet   Other Relevant Orders   UA/M w/rflx Culture, Routine       Follow up plan: Return in about 4 weeks (around 01/14/2020) for CPE & fasting labs.

## 2019-12-17 NOTE — Assessment & Plan Note (Signed)
Chronic, uncontrolled.  With more recent panic episodes lately.  Started back on Lexapro 5mg  by Neurologist yesterday.  Referral placed for Psychiatry today per patient request.

## 2019-12-17 NOTE — Patient Instructions (Signed)
Interstitial Cystitis  Interstitial cystitis is inflammation of the bladder. This may cause pain in the bladder area as well as a frequent and urgent need to urinate. The bladder is a hollow organ in the lower part of the abdomen. It stores urine after the urine is made in the kidneys. The severity of interstitial cystitis can vary from person to person. You may have flare-ups, and then your symptoms may go away for a while. For many people, it becomes a long-term (chronic) problem. What are the causes? The cause of this condition is not known. What increases the risk? The following factors may make you more likely to develop this condition:  You are female.  You have fibromyalgia.  You have irritable bowel syndrome (IBS).  You have endometriosis. This condition may be aggravated by:  Stress.  Smoking.  Spicy foods. What are the signs or symptoms? Symptoms of interstitial cystitis vary, and they can change over time. Symptoms may include:  Discomfort or pain in the bladder area, which is in the lower abdomen. Pain can range from mild to severe. The pain may change in intensity as the bladder fills with urine or as it empties.  Pain in the pelvic area, between the hip bones.  An urgent need to urinate.  Frequent urination.  Pain during urination.  Pain during sex.  Blood in the urine. For women, symptoms often get worse during menstruation. How is this diagnosed? This condition is diagnosed based on your symptoms, your medical history, and a physical exam. You may have tests to rule out other conditions, such as:  Urine tests.  Cystoscopy. For this test, a tool similar to a very thin telescope is used to look into your bladder.  Biopsy. This involves taking a sample of tissue from the bladder to be examined under a microscope. How is this treated? There is no cure for this condition, but treatment can help you control your symptoms. Work closely with your health care  provider to find the most effective treatments for you. Treatment options may include:  Medicines to relieve pain and reduce how often you feel the need to urinate.  Learning ways to control when you urinate (bladder training).  Lifestyle changes, such as changing your diet or taking steps to control stress.  Using a device that provides electrical stimulation to your nerves, which can relieve pain (neuromodulation therapy). The device is placed on your back, where it blocks the nerves that cause you to feel pain in your bladder area.  A procedure that stretches your bladder by filling it with air or fluid.  Surgery. This is rare. It is only done for extreme cases, if other treatments do not help. Follow these instructions at home: Bladder training   Use bladder training techniques as directed. Techniques may include: ? Urinating at scheduled times. ? Training yourself to delay urination. ? Doing exercises (Kegel exercises) to strengthen the muscles that control urine flow.  Keep a bladder diary. ? Write down the times that you urinate and any symptoms that you have. This can help you find out which foods, liquids, or activities make your symptoms worse. ? Use your bladder diary to schedule bathroom trips. If you are away from home, plan to be near a bathroom at each of your scheduled times.  Make sure that you urinate just before you leave the house and just before you go to bed. Eating and drinking  Make dietary changes as recommended by your health care provider. You   may need to avoid: ? Spicy foods. ? Foods that contain a lot of potassium.  Limit your intake of beverages that make you need to urinate. These include: ? Caffeinated beverages like soda, coffee, and tea. ? Alcohol. General instructions  Take over-the-counter and prescription medicines only as told by your health care provider.  Do not drink alcohol.  You can try a warm or cool compress over your bladder for  comfort.  Avoid wearing tight clothing.  Do not use any products that contain nicotine or tobacco, such as cigarettes and e-cigarettes. If you need help quitting, ask your health care provider.  Keep all follow-up visits as told by your health care provider. This is important. Contact a health care provider if you have:  Symptoms that do not get better with treatment.  Pain or discomfort that gets worse.  More frequent urges to urinate.  A fever. Get help right away if:  You have no control over when you urinate. Summary  Interstitial cystitis is inflammation of the bladder.  This condition may cause pain in the bladder area as well as a frequent and urgent need to urinate.  You may have flare-ups of the condition, and then it may go away for a while. For many people, it becomes a long-term (chronic) problem.  There is no cure for interstitial cystitis, but treatment methods are available to control your symptoms. This information is not intended to replace advice given to you by your health care provider. Make sure you discuss any questions you have with your health care provider. Document Revised: 09/26/2017 Document Reviewed: 09/08/2017 Elsevier Patient Education  2020 [employer]  Managing Anxiety, Adult After being diagnosed with an anxiety disorder, you may be relieved to know why you have felt or behaved a certain way. You may also feel overwhelmed about the treatment ahead and what it will mean for your life. With care and support, you can manage this condition and recover from it. How to manage lifestyle changes Managing stress and anxiety  Stress is your body's reaction to life changes and events, both good and bad. Most stress will last just a few hours, but stress can be ongoing and can lead to more than just stress. Although stress can play a major role in anxiety, it is not the same as anxiety. Stress is usually caused by something external, such as a deadline,  test, or competition. Stress normally passes after the triggering event has ended.  Anxiety is caused by something internal, such as imagining a terrible outcome or worrying that something will go wrong that will devastate you. Anxiety often does not go away even after the triggering event is over, and it can become long-term (chronic) worry. It is important to understand the differences between stress and anxiety and to manage your stress effectively so that it does not lead to an anxious response. Talk with your health care provider or a counselor to learn more about reducing anxiety and stress. He or she may suggest tension reduction techniques, such as:  Music therapy. This can include creating or listening to music that you enjoy and that inspires you.  Mindfulness-based meditation. This involves being aware of your normal breaths while not trying to control your breathing. It can be done while sitting or walking.  Centering prayer. This involves focusing on a word, phrase, or sacred image that means something to you and brings you peace.  Deep breathing. To do this, expand your stomach and inhale slowly through  your nose. Hold your breath for 3-5 seconds. Then exhale slowly, letting your stomach muscles relax.  Self-talk. This involves identifying thought patterns that lead to anxiety reactions and changing those patterns.  Muscle relaxation. This involves tensing muscles and then relaxing them. Choose a tension reduction technique that suits your lifestyle and personality. These techniques take time and practice. Set aside 5-15 minutes a day to do them. Therapists can offer counseling and training in these techniques. The training to help with anxiety may be covered by some insurance plans. Other things you can do to manage stress and anxiety include:  Keeping a stress/anxiety diary. This can help you learn what triggers your reaction and then learn ways to manage your response.  Thinking  about how you react to certain situations. You may not be able to control everything, but you can control your response.  Making time for activities that help you relax and not feeling guilty about spending your time in this way.  Visual imagery and yoga can help you stay calm and relax.  Medicines Medicines can help ease symptoms. Medicines for anxiety include:  Anti-anxiety drugs.  Antidepressants. Medicines are often used as a primary treatment for anxiety disorder. Medicines will be prescribed by a health care provider. When used together, medicines, psychotherapy, and tension reduction techniques may be the most effective treatment. Relationships Relationships can play a big part in helping you recover. Try to spend more time connecting with trusted friends and family members. Consider going to couples counseling, taking family education classes, or going to family therapy. Therapy can help you and others better understand your condition. How to recognize changes in your anxiety Everyone responds differently to treatment for anxiety. Recovery from anxiety happens when symptoms decrease and stop interfering with your daily activities at home or work. This may mean that you will start to:  Have better concentration and focus. Worry will interfere less in your daily thinking.  Sleep better.  Be less irritable.  Have more energy.  Have improved memory. It is important to recognize when your condition is getting worse. Contact your health care provider if your symptoms interfere with home or work and you feel like your condition is not improving. Follow these instructions at home: Activity  Exercise. Most adults should do the following: ? Exercise for at least 150 minutes each week. The exercise should increase your heart rate and make you sweat (moderate-intensity exercise). ? Strengthening exercises at least twice a week.  Get the right amount and quality of sleep. Most adults  need 7-9 hours of sleep each night. Lifestyle   Eat a healthy diet that includes plenty of vegetables, fruits, whole grains, low-fat dairy products, and lean protein. Do not eat a lot of foods that are high in solid fats, added sugars, or salt.  Make choices that simplify your life.  Do not use any products that contain nicotine or tobacco, such as cigarettes, e-cigarettes, and chewing tobacco. If you need help quitting, ask your health care provider.  Avoid caffeine, alcohol, and certain over-the-counter cold medicines. These may make you feel worse. Ask your pharmacist which medicines to avoid. General instructions  Take over-the-counter and prescription medicines only as told by your health care provider.  Keep all follow-up visits as told by your health care provider. This is important. Where to find support You can get help and support from these sources:  Self-help groups.  Online and [employer].  A trusted spiritual leader.  Couples counseling.  Family  education classes.  Family therapy. Where to find more information You may find that joining a support group helps you deal with your anxiety. The following sources can help you locate counselors or support groups near you:  Mental Health America: www.mentalhealthamerica.net  Anxiety and Depression Association of [place] (ADAA): ProgramCam.de  [employer] Mental Illness (NAMI): www.nami.org Contact a health care provider if you:  Have a hard time staying focused or finishing daily tasks.  Spend many hours a day feeling worried about everyday life.  Become exhausted by worry.  Start to have headaches, feel tense, or have nausea.  Urinate more than normal.  Have diarrhea. Get help right away if you have:  A racing heart and shortness of breath.  Thoughts of hurting yourself or others. If you ever feel like you may hurt yourself or others, or have thoughts about taking your own life, get  help right away. You can go to your nearest emergency department or call:  Your local emergency services (911 in the [place]  A suicide crisis helpline, such as the National Suicide Prevention Lifeline at 713-053-7148. This is open 24 hours a day. Summary  Taking steps to learn and use tension reduction techniques can help calm you and help prevent triggering an anxiety reaction.  When used together, medicines, psychotherapy, and tension reduction techniques may be the most effective treatment.  Family, friends, and partners can play a big part in helping you recover from an anxiety disorder. This information is not intended to replace advice given to you by your health care provider. Make sure you discuss any questions you have with your health care provider. Document Revised: 03/16/2019 Document Reviewed: 03/16/2019 Elsevier Patient Education  2020 [employer].

## 2019-12-17 NOTE — Assessment & Plan Note (Addendum)
Acute, ongoing.  IC has been stable and diet-controlled for a couple of years.  UA and microscopic negative in clinic today, will send out urine culture.  Given symptoms and history of UTI, will treat empirically with Bactrim bid x 5 days and f/u on culture.

## 2019-12-22 LAB — UA/M W/RFLX CULTURE, ROUTINE
Bilirubin, UA: NEGATIVE
Glucose, UA: NEGATIVE
Ketones, UA: NEGATIVE
Leukocytes,UA: NEGATIVE
Nitrite, UA: NEGATIVE
Protein,UA: NEGATIVE
RBC, UA: NEGATIVE
Specific Gravity, UA: 1.015 (ref 1.005–1.030)
Urobilinogen, Ur: 0.2 mg/dL (ref 0.2–1.0)
pH, UA: 7 (ref 5.0–7.5)

## 2019-12-22 LAB — MICROSCOPIC EXAMINATION
Bacteria, UA: NONE SEEN
RBC, Urine: NONE SEEN /hpf (ref 0–2)

## 2019-12-22 LAB — URINE CULTURE, REFLEX

## 2020-01-10 ENCOUNTER — Ambulatory Visit (INDEPENDENT_AMBULATORY_CARE_PROVIDER_SITE_OTHER): Payer: 59 | Admitting: Licensed Clinical Social Worker

## 2020-01-10 ENCOUNTER — Encounter: Payer: Self-pay | Admitting: Licensed Clinical Social Worker

## 2020-01-10 ENCOUNTER — Other Ambulatory Visit: Payer: Self-pay

## 2020-01-10 DIAGNOSIS — F411 Generalized anxiety disorder: Secondary | ICD-10-CM | POA: Diagnosis not present
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
## 2020-01-24 ENCOUNTER — Ambulatory Visit (INDEPENDENT_AMBULATORY_CARE_PROVIDER_SITE_OTHER): Payer: 59 | Admitting: Licensed Clinical Social Worker

## 2020-01-24 ENCOUNTER — Encounter: Payer: Self-pay | Admitting: Licensed Clinical Social Worker

## 2020-01-24 ENCOUNTER — Other Ambulatory Visit: Payer: Self-pay

## 2020-01-24 DIAGNOSIS — F411 Generalized anxiety disorder: Secondary | ICD-10-CM

## 2020-01-24 NOTE — Progress Notes (Signed)
Virtual Visit via Video Note  I connected with Martha [name] on 01/24/20 at  4:00 PM EDT by a video enabled telemedicine application and verified that I am speaking with the correct person using two identifiers.   I discussed the limitations of evaluation and management by telemedicine and the availability of in person appointments. The patient expressed understanding and agreed to proceed.  THERAPY PROGRESS NOTE  Session Time: 78 Minutes  Participation Level: Active  Behavioral Response: Well GroomedAlertAnxious  Type of Therapy: Individual Therapy  Treatment Goals addressed: Anxiety and Coping  Interventions: CBT  Summary: Martha [name] is a 49 y.o. female who presents with anxiety sxs. Pt reported that she has managed her anxiety fairly well since last session, however had a verbal argument with her mother over the telephone last night and has been experiencing physical sxs of anxiety including tension, clenched jaw, upset stomach and vomiting. Pt identified coping skills she used to decompress after their conversation and assessed what worked and what didn't. Pt reported a deep need to serve others and acknowledges that she is taking on too much. Pt discussed observations she has made about herself when anxious and the feedback she has received from supportive family members. Pt is motivated to prioritize self-care and to be more fully present with immediate family.  Suicidal/Homicidal: No  Therapist Response: Therapist met with patient for first session since CCA. Therapist and patient reviewed treatment plan and goals. Pt in agreement. Therapist validated patient feelings/concerns and offered support and encouragement. Therapist and patient discussed setting of boundaries as well as recognizing ties between patient's need to be of service to others and not getting the support she needed as a child from her mother. Therapist encouraged patient to identify use of coping skills. Pt  was very receptive.  Plan: Return again in 2 weeks.  Diagnosis: Axis I: Generalized Anxiety Disorder    Axis II: N/A    Josephine Igo, LCSW, LCAS 01/24/2020

## 2020-02-04 ENCOUNTER — Encounter: Payer: 59 | Admitting: Family Medicine

## 2020-02-10 ENCOUNTER — Ambulatory Visit: Payer: 59 | Admitting: Licensed Clinical Social Worker

## 2020-03-23 ENCOUNTER — Telehealth: Payer: Self-pay | Admitting: Family Medicine

## 2020-03-23 MED ORDER — ZOLPIDEM TARTRATE 10 MG PO TABS: 10.0000 mg | ORAL_TABLET | Freq: Every evening | ORAL | 0 refills | Status: DC | PRN

## 2020-03-23 NOTE — Telephone Encounter (Signed)
Called and spoke with pharmacy, they said that she has been getting   10/21/19 [facility] 30 tablets 11/18/19 [facility] 15 insurance 15 cash, using 2 of the refills  12/20/19 [facility] 15 insurance 15 cash, using 2 of the refills 01/17/20 [facility] 15 insurance 15 cash, using 2 of the refills 02/14/20 [facility] 15 insurance 15 cash, using 2 of the refills   They have 30 left on the active script, pharmacist states it was filled to many times by law.  She would like to discontinue the active prescription and start with a new one.  So when she fills 30 pills, it takes 2 of the refills   Megan the pharmacist  CMA: Try to get a PA on this medication to see if we can her 30 a month

## 2020-03-23 NOTE — Telephone Encounter (Signed)
She should not be due until June

## 2020-03-23 NOTE — Telephone Encounter (Signed)
Pharmacy called stating that the pts insurance will only pay for 15 pills in a 30 day time frame. They are requesting to have a new script sent over. Please advise .     [facility] - [place], [place] - [address]  [address] [place] [address]  Phone: [phone] Fax: [phone]  Not a 24 hour pharmacy; exact hours not known.

## 2020-03-23 NOTE — Telephone Encounter (Signed)
Please send a 30 day refill on the ambien, patient will pay out of pocket for half of the medication and insurance will pay for th e15.

## 2020-03-23 NOTE — Telephone Encounter (Signed)
Medication called in 

## 2020-05-02 ENCOUNTER — Other Ambulatory Visit: Payer: Self-pay

## 2020-05-02 ENCOUNTER — Ambulatory Visit: Payer: 59 | Admitting: Licensed Clinical Social Worker

## 2020-05-04 ENCOUNTER — Other Ambulatory Visit: Payer: Self-pay

## 2020-05-04 ENCOUNTER — Ambulatory Visit (INDEPENDENT_AMBULATORY_CARE_PROVIDER_SITE_OTHER): Payer: 59 | Admitting: Licensed Clinical Social Worker

## 2020-05-04 ENCOUNTER — Encounter: Payer: Self-pay | Admitting: Licensed Clinical Social Worker

## 2020-05-04 DIAGNOSIS — F411 Generalized anxiety disorder: Secondary | ICD-10-CM | POA: Diagnosis not present

## 2020-05-04 NOTE — Progress Notes (Signed)
Patient Location: Home  Provider Location: Home Office   Virtual Visit via Video Note  I connected with Martha [name] on 05/04/20 at  9:00 AM EDT by a video enabled telemedicine application and verified that I am speaking with the correct person using two identifiers.   I discussed the limitations of evaluation and management by telemedicine and the availability of in person appointments. The patient expressed understanding and agreed to proceed.  THERAPY PROGRESS NOTE  Session Time: 55 Minutes  Participation Level: Active  Behavioral Response: NeatAlertAnxious  Type of Therapy: Individual Therapy  Treatment Goals addressed: Anxiety and Coping  Interventions: CBT  Summary: Martha [name] is a 49 y.o. female who presents with anxiety sxs. Pt reported "I am not physically feeling well" and was referred to a specialist and dx with fibromyalgia. "I have been in a lot of pain" that has exacerbated anxiety sxs. Pt does not agree w/ dx and has an appointment  scheduled with a different specialist for second opinion. Pt discussed changes in job environment and managing work related stressors. Pt reported for next session she would like to delve into identity and role as a mother and her struggle to transition in relationships with her adult children.   Suicidal/Homicidal: No  Therapist Response: Therapist and pt met for follow up session. Therapist reviewed treatment plan and progression towards goals with patient. Pt in agreement with updates and revisions to plan. Therapist provided psycho-education around CBT for pain management. Therapist and patient discussed work related stress and recent transitions as well as identifying ways to set healthy boundaries and role playing assertive communication.  Plan: Return again in 2 weeks.  Diagnosis: Axis I: Generalized Anxiety Disorder    Axis II: N/A  Follow Up Instructions:  I discussed treatment plan with the patient. The patient was  provided an opportunity to ask questions and all were answered. The patient agreed with the plan and demonstrated an understanding of the instructions.   The patient was advised to call back or seek an in-person evaluation if the symptoms worsen or if the condition fails to improve as anticipated.  I provided 60 minutes of non-face-to-face time during this encounter.  Neill Jurewicz Wynelle Link, LCSW, LCAS

## 2020-05-08 ENCOUNTER — Other Ambulatory Visit: Payer: Self-pay | Admitting: Rheumatology

## 2020-05-08 DIAGNOSIS — R768 Other specified abnormal immunological findings in serum: Secondary | ICD-10-CM

## 2020-05-18 ENCOUNTER — Encounter: Payer: Self-pay | Admitting: Licensed Clinical Social Worker

## 2020-05-18 ENCOUNTER — Ambulatory Visit (INDEPENDENT_AMBULATORY_CARE_PROVIDER_SITE_OTHER): Payer: 59 | Admitting: Licensed Clinical Social Worker

## 2020-05-18 ENCOUNTER — Other Ambulatory Visit: Payer: Self-pay

## 2020-05-18 DIAGNOSIS — F411 Generalized anxiety disorder: Secondary | ICD-10-CM

## 2020-05-18 NOTE — Progress Notes (Signed)
Patient Location: Home  Provider Location: Home Office   Virtual Visit via Video Note  I connected with Martha [name] on 05/18/20 at  2:00 PM EDT by a video enabled telemedicine application and verified that I am speaking with the correct person using two identifiers.   I discussed the limitations of evaluation and management by telemedicine and the availability of in person appointments. The patient expressed understanding and agreed to proceed.  THERAPY PROGRESS NOTE  Session Time: 30 Minutes  Participation Level: Active  Behavioral Response: Well GroomedAlertAnxious  Type of Therapy: Individual Therapy  Treatment Goals addressed: Anxiety and Communication: Family Relationships  Interventions: CBT  Summary: Martha [name] is a 49 y.o. female who presents with anxiety sxs. Pt reported "I need feedback from my daughter on setting healthy boundaries. I don't think she knows I am really struggling with that". Pt described relationship with her adult children and how to communicate her needs for contact with them while also respecting their boundaries. Pt wants to respect and foster their own independence and not work herself up emotionally when they do not respond or fail to follow through on plans to meet. Pt plans to have this conversation with her daughter this weekend.   Suicidal/Homicidal: No  Therapist Response: Therapist and pt met for follow up session. Therapist and patient discussed patient's needs in the relationship and possible perspectives of her loved ones' experiences. Therapist provided psychoeducation around using the life balance wheel for assessing satisfaction level in important areas of patient's life and coming up with a SMART goal to increase satisfaction by 1. Pt was receptive.  Plan: Return again in 2 weeks.  Diagnosis: Axis I: Generalized Anxiety Disorder    Axis II: N/A  Josephine Igo, LCSW, LCAS 05/18/2020

## 2020-05-22 ENCOUNTER — Other Ambulatory Visit: Payer: Self-pay

## 2020-06-01 ENCOUNTER — Ambulatory Visit: Payer: 59 | Admitting: Licensed Clinical Social Worker

## 2020-06-01 ENCOUNTER — Other Ambulatory Visit: Payer: Self-pay

## 2020-06-01 ENCOUNTER — Encounter
Admission: RE | Admit: 2020-06-01 | Discharge: 2020-06-01 | Disposition: A | Discharge status: Home / Self Care | Payer: 59 | Source: Ambulatory Visit | Attending: Rheumatology | Admitting: Rheumatology

## 2020-06-01 DIAGNOSIS — R768 Other specified abnormal immunological findings in serum: Secondary | ICD-10-CM | POA: Insufficient documentation

## 2020-06-01 MED ORDER — TECHNETIUM TC 99M MEDRONATE IV KIT
22.8800 | PACK | Freq: Once | INTRAVENOUS | Status: AC | PRN
  Administered 2020-06-01: 22.88 via INTRAVENOUS

## 2020-09-04 ENCOUNTER — Other Ambulatory Visit: Payer: Self-pay | Admitting: Physical Medicine & Rehabilitation

## 2020-09-04 DIAGNOSIS — M542 Cervicalgia: Secondary | ICD-10-CM

## 2020-09-04 DIAGNOSIS — M5412 Radiculopathy, cervical region: Secondary | ICD-10-CM

## 2020-09-04 DIAGNOSIS — M5414 Radiculopathy, thoracic region: Secondary | ICD-10-CM

## 2020-09-04 DIAGNOSIS — M546 Pain in thoracic spine: Secondary | ICD-10-CM

## 2020-09-04 DIAGNOSIS — G8929 Other chronic pain: Secondary | ICD-10-CM

## 2020-09-05 ENCOUNTER — Other Ambulatory Visit: Payer: Self-pay | Admitting: Physical Medicine & Rehabilitation

## 2020-09-07 ENCOUNTER — Emergency Department: Payer: 59

## 2020-09-07 ENCOUNTER — Other Ambulatory Visit: Payer: Self-pay

## 2020-09-07 ENCOUNTER — Emergency Department
Admission: EM | Admit: 2020-09-07 | Discharge: 2020-09-07 | Disposition: A | Discharge status: Home / Self Care | Payer: 59 | Attending: Emergency Medicine | Admitting: Emergency Medicine

## 2020-09-07 DIAGNOSIS — F419 Anxiety disorder, unspecified: Secondary | ICD-10-CM | POA: Diagnosis not present

## 2020-09-07 DIAGNOSIS — R079 Chest pain, unspecified: Secondary | ICD-10-CM | POA: Insufficient documentation

## 2020-09-07 DIAGNOSIS — R0602 Shortness of breath: Secondary | ICD-10-CM | POA: Insufficient documentation

## 2020-09-07 DIAGNOSIS — R002 Palpitations: Secondary | ICD-10-CM | POA: Diagnosis not present

## 2020-09-07 LAB — CBC
HCT: 36.8 % (ref 36.0–46.0)
Hemoglobin: 12.7 g/dL (ref 12.0–15.0)
MCH: 31.4 pg (ref 26.0–34.0)
MCHC: 34.5 g/dL (ref 30.0–36.0)
MCV: 91.1 fL (ref 80.0–100.0)
Platelets: 189 10*3/uL (ref 150–400)
RBC: 4.04 MIL/uL (ref 3.87–5.11)
RDW: 12.7 % (ref 11.5–15.5)
WBC: 5.6 10*3/uL (ref 4.0–10.5)
nRBC: 0 % (ref 0.0–0.2)

## 2020-09-07 LAB — BASIC METABOLIC PANEL
Anion gap: 9 (ref 5–15)
BUN: 13 mg/dL (ref 6–20)
CO2: 24 mmol/L (ref 22–32)
Calcium: 8.8 mg/dL — ABNORMAL LOW (ref 8.9–10.3)
Chloride: 103 mmol/L (ref 98–111)
Creatinine, Ser: 0.58 mg/dL (ref 0.44–1.00)
GFR, Estimated: >60 mL/min (ref >60)
Glucose, Bld: 131 mg/dL — ABNORMAL HIGH (ref 70–99)
Potassium: 3.4 mmol/L — ABNORMAL LOW (ref 3.5–5.1)
Sodium: 136 mmol/L (ref 135–145)

## 2020-09-07 LAB — TROPONIN I (HIGH SENSITIVITY)
Troponin I (High Sensitivity): 3 ng/L (ref <18)
Troponin I (High Sensitivity): 2 ng/L (ref <18)

## 2020-09-07 MED ORDER — LORAZEPAM 2 MG/ML IJ SOLN
0.5000 mg | Freq: Once | INTRAMUSCULAR | Status: AC
  Administered 2020-09-07: 0.5 mg via INTRAVENOUS
  Filled 2020-09-07: qty 1

## 2020-09-07 MED ORDER — IOHEXOL 350 MG/ML SOLN
75.0000 mL | Freq: Once | INTRAVENOUS | Status: AC | PRN
  Administered 2020-09-07: 75 mL via INTRAVENOUS

## 2020-09-07 MED ORDER — LORAZEPAM 1 MG PO TABS
1.0000 mg | ORAL_TABLET | Freq: Every day | ORAL | 0 refills | Status: AC | PRN
Start: 2020-09-07 — End: 2021-09-07

## 2020-09-07 NOTE — ED Provider Notes (Signed)
[facility] Emergency Department Provider Note  Time seen: 8:51 PM  I have reviewed the triage vital signs and the nursing notes.   HISTORY  Chief Complaint Chest Pain   HPI Martha [name] is a 49 y.o. female with a past medical history of ADHD, anxiety, presents to the emergency department for chest pain or shortness of breath. According to the patient over the past 2 weeks or so she has been experiencing intermittent chest pain heart palpitations and shortness of breath. Patient has been seen by cardiology, just turned in a Holter monitor but has not yet heard the results. Patient states she has been experiencing central left-sided chest pain sharp and intermittent fairly constant but worse at times. Patient states tonight she had her worst episode yet where she was experiencing chest pain shortness of breath. Patient does admit anxiety but feels like this is worse than anxiety. Patient has been seen by cardiology in the state the patient possibly has an arrhythmia and started the patient on metoprolol.   Past Medical History:  Diagnosis Date  . ADHD (attention deficit hyperactivity disorder)   . Anxiety   . Depression   . Insomnia   . Interstitial cystitis    sees Dr. Patsi Sears   . Migraine headache     Patient Active Problem List   Diagnosis Date Noted  . Burning with urination 12/17/2019  . Controlled substance agreement signed 12/08/2018  . Migraine headache 11/23/2014  . Insomnia 02/27/2009  . Anxiety state 12/01/2007  . Depression 12/01/2007  . Attention deficit hyperactivity disorder (ADHD) 12/01/2007  . ALLERGIC RHINITIS 12/01/2007    Past Surgical History:  Procedure Laterality Date  . BREAST SURGERY  1995, 2016  . D&C  11/05/2018   Polyps removed - pathology okay  . Explant capsulectomy    . MEDIAL COLLATERAL LIGAMENT REPAIR, KNEE  2017    Prior to Admission medications   Medication Sig Start Date End Date Taking? Authorizing  Provider  cetirizine (ZYRTEC) 10 MG tablet Take 10 mg by mouth 2 (two) times daily.     [provider]  escitalopram (LEXAPRO) 5 MG tablet Take 5 mg by mouth daily. 12/16/19   [provider]  Lutein 40 MG CAPS Take by mouth.    [provider]  MAGNESIUM PO Take 450 mg by mouth.    [provider]  sulfamethoxazole-trimethoprim (BACTRIM DS) 800-160 MG tablet Take 1 tablet by mouth 2 (two) times daily. 12/17/19   Valentino Nose, NP  TURMERIC PO Take 1,200 mg by mouth 2 (two) times daily.     [provider]  zolmitriptan (ZOMIG) 5 MG tablet Take 1 tablet (5 mg total) by mouth as needed for migraine. 11/05/17 11/05/18  Nelwyn Salisbury, MD  zolpidem (AMBIEN) 10 MG tablet Take 1 tablet (10 mg total) by mouth at bedtime as needed for sleep. 03/23/20   Olevia Perches P, DO    Allergies  Allergen Reactions  . Oxycodone Rash    Family History  Problem Relation Age of Onset  . Prostate cancer Father     Social History Social History   Tobacco Use  . Smoking status: Never Smoker  . Smokeless tobacco: Never Used  Vaping Use  . Vaping Use: Never used  Substance Use Topics  . Alcohol use: Yes    Alcohol/week: 0.0 standard drinks    Comment: occ  . Drug use: No    Review of Systems Constitutional: Negative for fever. Cardiovascular: Intermittent  chest pain x2 weeks. Positive for palpitations. Respiratory: Shortness of breath at times. Gastrointestinal: Negative for abdominal pain, vomiting Genitourinary: Negative for urinary compaints Musculoskeletal: Negative for musculoskeletal complaints Neurological: Negative for headache All other ROS negative  ____________________________________________   PHYSICAL EXAM:  VITAL SIGNS: ED Triage Vitals  Enc Vitals Group     BP 09/07/20 1858 (!) 155/91     Pulse Rate 09/07/20 1858 77     Resp 09/07/20 1858 19     Temp 09/07/20 1858 97.6 F (36.4 C)     Temp src --      SpO2 09/07/20 1858  100 %     Weight 09/07/20 1853 140 lb (63.5 kg)     Height 09/07/20 1853 5\' 7"  (1.702 m)     Head Circumference --      Peak Flow --      Pain Score 09/07/20 1853 10     Pain Loc --      Pain Edu? --      Excl. in GC? --     Constitutional: Alert and oriented. Overall well-appearing but does appear anxious. Eyes: Normal exam ENT      Head: Normocephalic and atraumatic.      Mouth/Throat: Mucous membranes are moist. Cardiovascular: Normal rate, regular rhythm. No murmurs, rubs, or gallops. Respiratory: Normal respiratory effort without tachypnea nor retractions. Breath sounds are clear  Gastrointestinal: Soft and nontender. No distention.  Musculoskeletal: Nontender with normal range of motion in all extremities. No lower extremity tenderness or edema. Neurologic:  Normal speech and language. No gross focal neurologic deficits  Skin:  Skin is warm, dry and intact.  Psychiatric: Mood and affect are normal.   ____________________________________________    EKG  EKG viewed and interpreted by myself shows a sinus rhythm at 88 bpm with a narrow QRS, normal axis, normal intervals, nonspecific ST changes. There is electrical interference. I reviewed the patient's cardiac monitor while in the room patient appears to be in a sinus rhythm around 75 bpm.  ____________________________________________    RADIOLOGY  Chest x-ray is negative  CTA negative  ____________________________________________   INITIAL IMPRESSION / ASSESSMENT AND PLAN / ED COURSE  Pertinent labs & imaging results that were available during my care of the patient were reviewed by me and considered in my medical decision making (see chart for details).   Patient presents to the emergency department for 2 weeks of intermittent palpitation chest pain or shortness of breath, worse episode tonight. Patient does appear anxious, we will try a small dose of anxiety medication while we have the patient in the emergency  department. However given the patient's acute pain tonight with 2 weeks of intermittent symptoms we will obtain a CTA of the chest to help rule out pulmonary embolism. Reassuringly troponin is negative.  CTA of the chest is negative.  Lab work within normal limits including negative troponin.  Overall the patient appears well.  Some improvement with Ativan although patient continues to have some chest tightness.  We will discharge her a short course of anxiety medication.  Patient will follow up with her cardiologist and PCP.  JAZMARIE BIEVER was evaluated in Emergency Department on 09/07/2020 for the symptoms described in the history of present illness. She was evaluated in the context of the global COVID-19 pandemic, which necessitated consideration that the patient might be at risk for infection with the SARS-CoV-2 virus that causes COVID-19. Institutional protocols and algorithms that pertain to the evaluation of patients at risk  for COVID-19 are in a state of rapid change based on information released by regulatory bodies including the CDC and federal and state organizations. These policies and algorithms were followed during the patient's care in the ED.  ____________________________________________   FINAL CLINICAL IMPRESSION(S) / ED DIAGNOSES  Chest pain   Minna Antis, MD 09/07/20 2250

## 2020-09-07 NOTE — ED Notes (Signed)
Pt refused POC urine preg

## 2020-09-07 NOTE — ED Notes (Signed)
Patient transported to CT 

## 2020-09-07 NOTE — ED Triage Notes (Addendum)
Pt comes via POV from home with c/o CP for 2 weeks. Pt states it has gotten worse so she came in to be seen. Pt holding chest and moaning. Pt states hx of arrhthymia. Pt appears uncomfortable.  Pt states extreme SOb that started at lunch today. Pt states over last hour CP has gotten worse. Pt states 10/10 pain. Pt shaking and states some from pain and other from being cold.

## 2020-09-09 ENCOUNTER — Ambulatory Visit
Admission: RE | Admit: 2020-09-09 | Discharge: 2020-09-09 | Disposition: A | Discharge status: Home / Self Care | Payer: 59 | Source: Ambulatory Visit | Attending: Physical Medicine & Rehabilitation | Admitting: Physical Medicine & Rehabilitation

## 2020-09-09 DIAGNOSIS — M5412 Radiculopathy, cervical region: Secondary | ICD-10-CM

## 2020-09-09 DIAGNOSIS — M542 Cervicalgia: Secondary | ICD-10-CM

## 2020-09-11 ENCOUNTER — Other Ambulatory Visit: Payer: 59

## 2020-09-20 ENCOUNTER — Other Ambulatory Visit: Payer: 59

## 2020-09-27 HISTORY — PX: CARDIAC ELECTROPHYSIOLOGY STUDY AND ABLATION: SHX1294

## 2020-10-11 ENCOUNTER — Ambulatory Visit
Admission: RE | Admit: 2020-10-11 | Discharge: 2020-10-11 | Disposition: A | Discharge status: Home / Self Care | Payer: 59 | Source: Ambulatory Visit | Attending: Physical Medicine & Rehabilitation | Admitting: Physical Medicine & Rehabilitation

## 2020-10-11 DIAGNOSIS — G8929 Other chronic pain: Secondary | ICD-10-CM

## 2020-10-11 DIAGNOSIS — M5414 Radiculopathy, thoracic region: Secondary | ICD-10-CM

## 2020-12-28 ENCOUNTER — Other Ambulatory Visit
Admission: RE | Admit: 2020-12-28 | Discharge: 2020-12-28 | Disposition: A | Discharge status: Home / Self Care | Payer: No Typology Code available for payment source | Source: Ambulatory Visit | Attending: Pulmonary Disease | Admitting: Pulmonary Disease

## 2020-12-28 DIAGNOSIS — R0600 Dyspnea, unspecified: Secondary | ICD-10-CM | POA: Diagnosis present

## 2020-12-28 LAB — D-DIMER, QUANTITATIVE: D-Dimer, Quant: <0.27 ug/mL-FEU (ref 0.00–0.50)

## 2021-01-01 ENCOUNTER — Other Ambulatory Visit: Payer: Self-pay | Admitting: Pulmonary Disease

## 2021-01-01 ENCOUNTER — Other Ambulatory Visit (HOSPITAL_COMMUNITY): Payer: Self-pay | Admitting: Pulmonary Disease

## 2021-01-01 DIAGNOSIS — R0609 Other forms of dyspnea: Secondary | ICD-10-CM

## 2021-01-01 DIAGNOSIS — R0600 Dyspnea, unspecified: Secondary | ICD-10-CM

## 2021-01-13 ENCOUNTER — Other Ambulatory Visit: Payer: Self-pay | Admitting: Pulmonary Disease

## 2021-01-13 DIAGNOSIS — R079 Chest pain, unspecified: Secondary | ICD-10-CM

## 2021-01-15 ENCOUNTER — Ambulatory Visit
Admission: RE | Admit: 2021-01-15 | Discharge: 2021-01-15 | Disposition: A | Discharge status: Home / Self Care | Payer: No Typology Code available for payment source | Source: Ambulatory Visit | Attending: Pulmonary Disease | Admitting: Pulmonary Disease

## 2021-01-15 DIAGNOSIS — R079 Chest pain, unspecified: Secondary | ICD-10-CM

## 2021-01-15 MED ORDER — IOPAMIDOL (ISOVUE-370) INJECTION 76%
75.0000 mL | Freq: Once | INTRAVENOUS | Status: AC | PRN
  Administered 2021-01-15: 75 mL via INTRAVENOUS

## 2021-01-18 ENCOUNTER — Ambulatory Visit: Payer: No Typology Code available for payment source

## 2021-01-22 ENCOUNTER — Inpatient Hospital Stay: Admission: RE | Admit: 2021-01-22 | Payer: No Typology Code available for payment source | Source: Ambulatory Visit

## 2021-01-26 ENCOUNTER — Ambulatory Visit
Admission: RE | Admit: 2021-01-26 | Discharge: 2021-01-26 | Disposition: A | Discharge status: Home / Self Care | Payer: No Typology Code available for payment source | Source: Home / Self Care | Attending: Pulmonary Disease | Admitting: Pulmonary Disease

## 2021-01-26 ENCOUNTER — Ambulatory Visit
Admission: RE | Admit: 2021-01-26 | Discharge: 2021-01-26 | Disposition: A | Discharge status: Home / Self Care | Payer: No Typology Code available for payment source | Source: Ambulatory Visit | Attending: Pulmonary Disease | Admitting: Pulmonary Disease

## 2021-01-26 DIAGNOSIS — R0600 Dyspnea, unspecified: Secondary | ICD-10-CM | POA: Insufficient documentation

## 2021-01-26 DIAGNOSIS — R0609 Other forms of dyspnea: Secondary | ICD-10-CM

## 2021-05-16 ENCOUNTER — Other Ambulatory Visit
Admission: RE | Admit: 2021-05-16 | Discharge: 2021-05-16 | Disposition: A | Discharge status: Home / Self Care | Payer: No Typology Code available for payment source | Source: Ambulatory Visit | Attending: Rheumatology | Admitting: Rheumatology

## 2021-05-16 DIAGNOSIS — R0600 Dyspnea, unspecified: Secondary | ICD-10-CM | POA: Diagnosis present

## 2021-05-16 LAB — D-DIMER, QUANTITATIVE: D-Dimer, Quant: <0.27 ug/mL-FEU (ref 0.00–0.50)

## 2021-07-01 ENCOUNTER — Encounter: Payer: Self-pay | Admitting: Emergency Medicine

## 2021-07-01 ENCOUNTER — Other Ambulatory Visit: Payer: Self-pay

## 2021-07-01 ENCOUNTER — Ambulatory Visit
Admission: EM | Admit: 2021-07-01 | Discharge: 2021-07-01 | Disposition: A | Discharge status: Home / Self Care | Payer: No Typology Code available for payment source | Attending: Physician Assistant | Admitting: Physician Assistant

## 2021-07-01 DIAGNOSIS — U071 COVID-19: Secondary | ICD-10-CM | POA: Insufficient documentation

## 2021-07-01 DIAGNOSIS — B349 Viral infection, unspecified: Secondary | ICD-10-CM

## 2021-07-01 DIAGNOSIS — R0981 Nasal congestion: Secondary | ICD-10-CM | POA: Diagnosis present

## 2021-07-01 DIAGNOSIS — R059 Cough, unspecified: Secondary | ICD-10-CM

## 2021-07-01 LAB — POCT RAPID STREP A: Streptococcus, Group A Screen (Direct): NEGATIVE

## 2021-07-01 NOTE — ED Provider Notes (Signed)
[facility]    CSN: [id number] Arrival date & time: 07/01/21  0902      History   Chief Complaint Chief Complaint  Patient presents with   Sore Throat   Cough    HPI Martha [name] is a 50 y.o. female presenting for 2-day history of fatigue, body aches, sinus pressure, nasal congestion, ear pressure, sore throat, and occasionally productive cough.  She denies any fevers, chest pain, breathing occultly, nausea/vomiting or diarrhea.  Patient says she did go to a red [employer] chili [place] concert a couple days ago.  She denies any known exposure to COVID-19.  She has been vaccinated for COVID-19 and boosted x1.  Patient has an upcoming back surgery scheduled for 07/06/2021 and says she hopes to feel well by then.  She has been taking over-the-counter oral decongestants.  She does have history of rheumatoid arthritis and chronic pain.  No other complaints or concerns.  HPI  Past Medical History:  Diagnosis Date   ADHD (attention deficit hyperactivity disorder)    Anxiety    Depression    Insomnia    Interstitial cystitis    sees Dr. Patsi Sears    Migraine headache     Patient Active Problem List   Diagnosis Date Noted   Burning with urination 12/17/2019   Controlled substance agreement signed 12/08/2018   Migraine headache 11/23/2014   Insomnia 02/27/2009   Anxiety state 12/01/2007   Depression 12/01/2007   Attention deficit hyperactivity disorder (ADHD) 12/01/2007   ALLERGIC RHINITIS 12/01/2007    Past Surgical History:  Procedure Laterality Date   BREAST SURGERY  1995, 2016   D&C  11/05/2018   Polyps removed - pathology okay   Explant capsulectomy     MEDIAL COLLATERAL LIGAMENT REPAIR, KNEE  2017    OB History   No obstetric history on file.      Home Medications    Prior to Admission medications   Medication Sig Start Date End Date Taking? Authorizing Provider  busPIRone (BUSPAR) 7.5 MG tablet Take 7.5 mg by mouth 3 (three) times daily.   Yes  [provider]  cetirizine (ZYRTEC) 10 MG tablet Take 10 mg by mouth 2 (two) times daily.    Yes [provider]  MAGNESIUM PO Take 450 mg by mouth.   Yes [provider]  predniSONE (DELTASONE) 5 MG tablet Take by mouth. 07/31/20  Yes [provider]  TURMERIC PO Take 1,200 mg by mouth 2 (two) times daily.    Yes [provider]  zolmitriptan (ZOMIG) 5 MG tablet Take 1 tablet (5 mg total) by mouth as needed for migraine. 11/05/17 07/01/21 Yes Nelwyn Salisbury, MD  zolpidem (AMBIEN) 10 MG tablet Take 1 tablet (10 mg total) by mouth at bedtime as needed for sleep. 03/23/20  Yes Johnson, Megan P, DO  diltiazem (CARDIZEM CD) 180 MG 24 hr capsule Take 180 mg by mouth daily. 05/29/21   [provider]  escitalopram (LEXAPRO) 5 MG tablet Take 5 mg by mouth daily. 12/16/19   [provider]  HUMIRA PEN 40 MG/0.4ML PNKT SMARTSIG:40 Milligram(s) SUB-Q Every 2 Weeks 06/27/21   [provider]  LORazepam (ATIVAN) 1 MG tablet Take 1 tablet (1 mg total) by mouth daily as needed for anxiety. 09/07/20 09/07/21  Minna Antis, MD  Lutein 40 MG CAPS Take by mouth.    [provider]  traMADol (ULTRAM) 50 MG tablet Take 50 mg by mouth every 6 (six) hours as needed.  06/06/21   [provider]    Family History Family History  Problem Relation Age of Onset   Prostate cancer Father     Social History Social History   Tobacco Use   Smoking status: Never   Smokeless tobacco: Never  Vaping Use   Vaping Use: Never used  Substance Use Topics   Alcohol use: Yes    Alcohol/week: 0.0 standard drinks    Comment: occ   Drug use: No     Allergies   Oxycodone   Review of Systems Review of Systems  Constitutional:  Positive for fatigue. Negative for chills, diaphoresis and fever.  HENT:  Positive for congestion, rhinorrhea, sinus pressure and sore throat. Negative for ear pain and sinus pain.   Respiratory:  Positive for  cough. Negative for shortness of breath.   Cardiovascular:  Negative for chest pain.  Gastrointestinal:  Negative for abdominal pain, nausea and vomiting.  Musculoskeletal:  Positive for myalgias. Negative for arthralgias.  Skin:  Negative for rash.  Neurological:  Negative for weakness and headaches.  Hematological:  Negative for adenopathy.    Physical Exam Triage Vital Signs ED Triage Vitals  Enc Vitals Group     BP 07/01/21 0929 124/72     Pulse Rate 07/01/21 0929 72     Resp 07/01/21 0929 18     Temp 07/01/21 0929 98.3 F (36.8 C)     Temp Source 07/01/21 0929 Oral     SpO2 07/01/21 0929 100 %     Weight 07/01/21 0925 139 lb 15.9 oz (63.5 kg)     Height 07/01/21 0925 5\' 7"  (1.702 m)     Head Circumference --      Peak Flow --      Pain Score 07/01/21 0924 7     Pain Loc --      Pain Edu? --      Excl. in GC? --    No data found.  Updated Vital Signs BP 124/72 (BP Location: Left Arm)   Pulse 72   Temp 98.3 F (36.8 C) (Oral)   Resp 18   Ht 5\' 7"  (1.702 m)   Wt 139 lb 15.9 oz (63.5 kg)   LMP 06/18/2021 (Approximate)   SpO2 100%   BMI 21.93 kg/m      Physical Exam Vitals and nursing note reviewed.  Constitutional:      General: She is not in acute distress.    Appearance: Normal appearance. She is ill-appearing. She is not toxic-appearing.  HENT:     Head: Normocephalic and atraumatic.     Right Ear: Tympanic membrane, ear canal and external ear normal.     Left Ear: Tympanic membrane, ear canal and external ear normal.     Nose: Congestion present.     Mouth/Throat:     Mouth: Mucous membranes are moist.     Pharynx: Oropharynx is clear. Posterior oropharyngeal erythema present.  Eyes:     General: No scleral icterus.       Right eye: No discharge.        Left eye: No discharge.     Conjunctiva/sclera: Conjunctivae normal.  Cardiovascular:     Rate and Rhythm: Normal rate and regular rhythm.     Heart sounds: Normal heart sounds.  Pulmonary:      Effort: Pulmonary effort is normal. No respiratory distress.     Breath sounds: Normal breath sounds.  Musculoskeletal:     Cervical back: Neck supple.  Skin:  General: Skin is dry.  Neurological:     General: No focal deficit present.     Mental Status: She is alert. Mental status is at baseline.     Motor: No weakness.     Gait: Gait normal.  Psychiatric:        Mood and Affect: Mood normal.        Behavior: Behavior normal.        Thought Content: Thought content normal.     UC Treatments / Results  Labs (all labs ordered are listed, but only abnormal results are displayed) Labs Reviewed  CULTURE, GROUP A STREP ([facility])  SARS CORONAVIRUS 2 (TAT 6-24 HRS)  POCT RAPID STREP A, ED / UC  POCT RAPID STREP A    EKG   Radiology No results found.  Procedures Procedures (including critical care time)  Medications Ordered in UC Medications - No data to display  Initial Impression / Assessment and Plan / UC Course  I have reviewed the triage vital signs and the nursing notes.  Pertinent labs & imaging results that were available during my care of the patient were reviewed by me and considered in my medical decision making (see chart for details).  50 year old female presenting for 2-day history of fatigue, body aches, sinus pressure, nasal congestion, ear pressure, sore throat, and occasionally productive cough.   Vital signs all normal and stable.  Patient is ill-appearing but nontoxic.  She does have nasal congestion and mild posterior pharyngeal erythema.  Rapid strep test negative.  Culture sent.  PCR COVID test obtained.  Current CDC guidelines, isolation protocol and ED precautions reviewed with patient.  I do have high suspicion for COVID-19 given her symptoms.  If patient is positive for COVID-19 she would be a candidate for antiviral medication.  Advised her we will contact her if positive and discuss that option further.  Reviewed how to access results.  Advised  isolation till then.  Advised to continue with supportive care including the over-the-counter decongestants, Tylenol and increasing rest and fluids.  Go to ED for any severe acute worsening of any symptoms.  Final Clinical Impressions(s) / UC Diagnoses   Final diagnoses:  Viral illness  Cough  Nasal congestion     Discharge Instructions      -Negative rapid strep. We will send culture -I have high suspicion for COVID 19. Will call if + and we can start you on an antiviral medication -Isolate 5 days from symptom onset and mask x 5 days. Today is day #2. -Continue decongestants and increase rest and fluids -Call surgeon if COVID + as you will likely need to have the surgery pushed  You have received COVID testing today either for positive exposure, concerning symptoms that could be related to COVID infection, screening purposes, or re-testing after confirmed positive.  Your test obtained today checks for active viral infection in the last 1-2 weeks. If your test is negative now, you can still test positive later. So, if you do develop symptoms you should either get re-tested and/or isolate x 5 days and then strict mask use x 5 days (unvaccinated) or mask use x 10 days (vaccinated). Please follow CDC guidelines.  While Rapid antigen tests come back in 15-20 minutes, send out PCR/molecular test results typically come back within 1-3 days. In the mean time, if you are symptomatic, assume this could be a positive test and treat/monitor yourself as if you do have COVID.   We will call with test results if  positive. Please download the MyChart app and set up a profile to access test results.   If symptomatic, go home and rest. Push fluids. Take Tylenol as needed for discomfort. Gargle warm salt water. Throat lozenges. Take Mucinex DM or Robitussin for cough. Humidifier in bedroom to ease coughing. Warm showers. Also review the COVID handout for more information.  COVID-19 INFECTION: The  incubation period of COVID-19 is approximately 14 days after exposure, with most symptoms developing in roughly 4-5 days. Symptoms may range in severity from mild to critically severe. Roughly 80% of those infected will have mild symptoms. People of any age may become infected with COVID-19 and have the ability to transmit the virus. The most common symptoms include: fever, fatigue, cough, body aches, headaches, sore throat, nasal congestion, shortness of breath, nausea, vomiting, diarrhea, changes in smell and/or taste.    COURSE OF ILLNESS Some patients may begin with mild disease which can progress quickly into critical symptoms. If your symptoms are worsening please call ahead to the Emergency Department and proceed there for further treatment. Recovery time appears to be roughly 1-2 weeks for mild symptoms and 3-6 weeks for severe disease.   GO IMMEDIATELY TO ER FOR FEVER YOU ARE UNABLE TO GET DOWN WITH TYLENOL, BREATHING PROBLEMS, CHEST PAIN, FATIGUE, LETHARGY, INABILITY TO EAT OR DRINK, ETC  QUARANTINE AND ISOLATION: To help decrease the spread of COVID-19 please remain isolated if you have COVID infection or are highly suspected to have COVID infection. This means -stay home and isolate to one room in the home if you live with others. Do not share a bed or bathroom with others while ill, sanitize and wipe down all countertops and keep common areas clean and disinfected. Stay home for 5 days. If you have no symptoms or your symptoms are resolving after 5 days, you can leave your house. Continue to wear a mask around others for 5 additional days. If you have been in close contact (within 6 feet) of someone diagnosed with COVID 19, you are advised to quarantine in your home for 14 days as symptoms can develop anywhere from 2-14 days after exposure to the virus. If you develop symptoms, you  must isolate.  Most current guidelines for COVID after exposure -unvaccinated: isolate 5 days and strict mask  use x 5 days. Test on day 5 is possible -vaccinated: wear mask x 10 days if symptoms do not develop -You do not necessarily need to be tested for COVID if you have + exposure and  develop symptoms. Just isolate at home x10 days from symptom onset During this global pandemic, CDC advises to practice social distancing, try to stay at least 85ft away from others at all times. Wear a face covering. Wash and sanitize your hands regularly and avoid going anywhere that is not necessary.  KEEP IN MIND THAT THE COVID TEST IS NOT 100% ACCURATE AND YOU SHOULD STILL DO EVERYTHING TO PREVENT POTENTIAL SPREAD OF VIRUS TO OTHERS (WEAR MASK, WEAR GLOVES, WASH HANDS AND SANITIZE REGULARLY). IF INITIAL TEST IS NEGATIVE, THIS MAY NOT MEAN YOU ARE DEFINITELY NEGATIVE. MOST ACCURATE TESTING IS DONE 5-7 DAYS AFTER EXPOSURE.   It is not advised by CDC to get re-tested after receiving a positive COVID test since you can still test positive for weeks to months after you have already cleared the virus.   *If you have not been vaccinated for COVID, I strongly suggest you consider getting vaccinated as long as there are no contraindications.  ED Prescriptions   None    PDMP not reviewed this encounter.   Shirlee Latch, PA-C 07/01/21 1020

## 2021-07-01 NOTE — ED Triage Notes (Signed)
Pt c/o sore throat, fatigue, cough, runny nose, nasal congestion. Started about 2 days ago. She states she is scheduled for spine surgery this week. Denies fever. She states she did a home test yesterday and was negative.

## 2021-07-01 NOTE — Discharge Instructions (Addendum)
-Negative rapid strep. We will send culture -I have high suspicion for COVID 19. Will call if + and we can start you on an antiviral medication -Isolate 5 days from symptom onset and mask x 5 days. Today is day #2. -Continue decongestants and increase rest and fluids -Call surgeon if COVID + as you will likely need to have the surgery pushed  You have received COVID testing today either for positive exposure, concerning symptoms that could be related to COVID infection, screening purposes, or re-testing after confirmed positive.  Your test obtained today checks for active viral infection in the last 1-2 weeks. If your test is negative now, you can still test positive later. So, if you do develop symptoms you should either get re-tested and/or isolate x 5 days and then strict mask use x 5 days (unvaccinated) or mask use x 10 days (vaccinated). Please follow CDC guidelines.  While Rapid antigen tests come back in 15-20 minutes, send out PCR/molecular test results typically come back within 1-3 days. In the mean time, if you are symptomatic, assume this could be a positive test and treat/monitor yourself as if you do have COVID.   We will call with test results if positive. Please download the MyChart app and set up a profile to access test results.   If symptomatic, go home and rest. Push fluids. Take Tylenol as needed for discomfort. Gargle warm salt water. Throat lozenges. Take Mucinex DM or Robitussin for cough. Humidifier in bedroom to ease coughing. Warm showers. Also review the COVID handout for more information.  COVID-19 INFECTION: The incubation period of COVID-19 is approximately 14 days after exposure, with most symptoms developing in roughly 4-5 days. Symptoms may range in severity from mild to critically severe. Roughly 80% of those infected will have mild symptoms. People of any age may become infected with COVID-19 and have the ability to transmit the virus. The most common symptoms  include: fever, fatigue, cough, body aches, headaches, sore throat, nasal congestion, shortness of breath, nausea, vomiting, diarrhea, changes in smell and/or taste.    COURSE OF ILLNESS Some patients may begin with mild disease which can progress quickly into critical symptoms. If your symptoms are worsening please call ahead to the Emergency Department and proceed there for further treatment. Recovery time appears to be roughly 1-2 weeks for mild symptoms and 3-6 weeks for severe disease.   GO IMMEDIATELY TO ER FOR FEVER YOU ARE UNABLE TO GET DOWN WITH TYLENOL, BREATHING PROBLEMS, CHEST PAIN, FATIGUE, LETHARGY, INABILITY TO EAT OR DRINK, ETC  QUARANTINE AND ISOLATION: To help decrease the spread of COVID-19 please remain isolated if you have COVID infection or are highly suspected to have COVID infection. This means -stay home and isolate to one room in the home if you live with others. Do not share a bed or bathroom with others while ill, sanitize and wipe down all countertops and keep common areas clean and disinfected. Stay home for 5 days. If you have no symptoms or your symptoms are resolving after 5 days, you can leave your house. Continue to wear a mask around others for 5 additional days. If you have been in close contact (within 6 feet) of someone diagnosed with COVID 19, you are advised to quarantine in your home for 14 days as symptoms can develop anywhere from 2-14 days after exposure to the virus. If you develop symptoms, you  must isolate.  Most current guidelines for COVID after exposure -unvaccinated: isolate 5 days and strict  mask use x 5 days. Test on day 5 is possible -vaccinated: wear mask x 10 days if symptoms do not develop -You do not necessarily need to be tested for COVID if you have + exposure and  develop symptoms. Just isolate at home x10 days from symptom onset During this global pandemic, CDC advises to practice social distancing, try to stay at least 67ft away from others  at all times. Wear a face covering. Wash and sanitize your hands regularly and avoid going anywhere that is not necessary.  KEEP IN MIND THAT THE COVID TEST IS NOT 100% ACCURATE AND YOU SHOULD STILL DO EVERYTHING TO PREVENT POTENTIAL SPREAD OF VIRUS TO OTHERS (WEAR MASK, WEAR GLOVES, WASH HANDS AND SANITIZE REGULARLY). IF INITIAL TEST IS NEGATIVE, THIS MAY NOT MEAN YOU ARE DEFINITELY NEGATIVE. MOST ACCURATE TESTING IS DONE 5-7 DAYS AFTER EXPOSURE.   It is not advised by CDC to get re-tested after receiving a positive COVID test since you can still test positive for weeks to months after you have already cleared the virus.   *If you have not been vaccinated for COVID, I strongly suggest you consider getting vaccinated as long as there are no contraindications.

## 2021-07-02 ENCOUNTER — Telehealth: Payer: Self-pay | Admitting: Emergency Medicine

## 2021-07-02 ENCOUNTER — Telehealth: Payer: Self-pay

## 2021-07-02 LAB — SARS CORONAVIRUS 2 (TAT 6-24 HRS): SARS Coronavirus 2: POSITIVE — AB

## 2021-07-02 MED ORDER — NIRMATRELVIR/RITONAVIR (PAXLOVID)TABLET: 3.0000 | ORAL_TABLET | Freq: Two times a day (BID) | ORAL | 0 refills | Status: DC

## 2021-07-02 MED ORDER — NIRMATRELVIR/RITONAVIR (PAXLOVID)TABLET: 3.0000 | ORAL_TABLET | Freq: Two times a day (BID) | ORAL | 0 refills | Status: AC

## 2021-07-02 NOTE — Telephone Encounter (Signed)
Positive.  Per the encounter the plan was to treat with Paxlovid.  Patient's most recent CMP was from November 2021 with a GFR greater than 60.  [facility] [place].

## 2021-07-04 LAB — CULTURE, GROUP A STREP (THRC)

## 2021-07-17 ENCOUNTER — Encounter: Payer: Self-pay | Admitting: Emergency Medicine

## 2021-07-17 ENCOUNTER — Other Ambulatory Visit: Payer: Self-pay

## 2021-07-17 ENCOUNTER — Ambulatory Visit
Admission: EM | Admit: 2021-07-17 | Discharge: 2021-07-17 | Disposition: A | Discharge status: Home / Self Care | Payer: No Typology Code available for payment source | Attending: Emergency Medicine | Admitting: Emergency Medicine

## 2021-07-17 ENCOUNTER — Ambulatory Visit (INDEPENDENT_AMBULATORY_CARE_PROVIDER_SITE_OTHER): Payer: No Typology Code available for payment source

## 2021-07-17 DIAGNOSIS — U071 COVID-19: Secondary | ICD-10-CM | POA: Insufficient documentation

## 2021-07-17 DIAGNOSIS — R0602 Shortness of breath: Secondary | ICD-10-CM | POA: Diagnosis not present

## 2021-07-17 DIAGNOSIS — R059 Cough, unspecified: Secondary | ICD-10-CM

## 2021-07-17 DIAGNOSIS — J0100 Acute maxillary sinusitis, unspecified: Secondary | ICD-10-CM | POA: Insufficient documentation

## 2021-07-17 LAB — BASIC METABOLIC PANEL
Anion gap: 4 — ABNORMAL LOW (ref 5–15)
BUN: 17 mg/dL (ref 6–20)
CO2: 26 mmol/L (ref 22–32)
Calcium: 8.9 mg/dL (ref 8.9–10.3)
Chloride: 104 mmol/L (ref 98–111)
Creatinine, Ser: 0.7 mg/dL (ref 0.44–1.00)
GFR, Estimated: >60 mL/min (ref >60)
Glucose, Bld: 87 mg/dL (ref 70–99)
Potassium: 3.9 mmol/L (ref 3.5–5.1)
Sodium: 134 mmol/L — ABNORMAL LOW (ref 135–145)

## 2021-07-17 LAB — D-DIMER, QUANTITATIVE: D-Dimer, Quant: 0.3 ug/mL-FEU (ref 0.00–0.50)

## 2021-07-17 MED ORDER — AMOXICILLIN-POT CLAVULANATE 875-125 MG PO TABS: 1.0000 | ORAL_TABLET | Freq: Two times a day (BID) | ORAL | 0 refills | Status: DC

## 2021-07-17 MED ORDER — IBUPROFEN 600 MG PO TABS: 600.0000 mg | ORAL_TABLET | Freq: Four times a day (QID) | ORAL | 0 refills | Status: DC | PRN

## 2021-07-17 MED ORDER — HYDROCOD POLST-CPM POLST ER 10-8 MG/5ML PO SUER: 5.0000 mL | Freq: Two times a day (BID) | ORAL | 0 refills | Status: DC | PRN

## 2021-07-17 MED ORDER — LEVALBUTEROL TARTRATE 45 MCG/ACT IN AERO: 1.0000 | INHALATION_SPRAY | Freq: Four times a day (QID) | RESPIRATORY_TRACT | 12 refills | Status: DC | PRN

## 2021-07-17 MED ORDER — AEROCHAMBER PLUS MISC: 2 refills | Status: DC

## 2021-07-17 MED ORDER — FLUTICASONE PROPIONATE 50 MCG/ACT NA SUSP: 2.0000 | Freq: Every day | NASAL | 0 refills | Status: DC

## 2021-07-17 NOTE — Discharge Instructions (Addendum)
Do not take Tramadol, Ambien or Ativan while taking Tussionex.  Tussionex has hydrocodone in it.  It will help you sleep at night.  2 puffs from your Xopenex inhaler using your spacer every 4-6 hours as needed.  Check in with your cardiologist regarding the prednisone.  Finish the Augmentin, even if you feel better.  Flonase, saline nasal irrigation with a Lloyd Huger Med rinse and distilled water as often as you want, 600 mg of ibuprofen combined with 1000 mg of Tylenol together 3-4 times a day as needed for chest wall pain.  I will contact you if and only if your dimer is positive, and you will need to go to the emergency department to rule out PE.

## 2021-07-17 NOTE — ED Provider Notes (Signed)
HPI  SUBJECTIVE:  Martha [name] is a 50 y.o. female who presents with a persistent, worsening cough since being diagnosed with COVID on 9/4.  She was treated with Paxlovid she reports worsening nasal congestion, clear/yellow rhinorrhea, postnasal drip, 1 week of maxillary sinus pain and pressure.  She reports 3 days dyspnea on exertion, shortness of breath, sharp, pleuritic pain with inspiration, 4 to 5 days of substernal chest pain described as tightness, burning, which has become constant over the past 2 days.  Unsure if it gets worse with exertion.  States that she has been having issues with runs of sinus tachycardia since being sick.  She denies wheezing, hemoptysis, sore throat.  No chest pressure, heaviness, nausea ,  diaphoresis, radiation of this pain up her neck, down her arm, through to her back.  No calf pain, swelling.  There is no positional component to it.  She has been prescribed Tessalon and a Z-Pak by her PMD last week for the cough, states that the [place] helped.  No difference with the Z-Pak.  Lying down flat makes the shortness of breath and cough worse, and torso rotation/arm movement aggravates the chest pain.  She states that she had a positive home COVID test last night and a  negative one this morning, so she went  to a drugstore for PCR testing today. This is pending..  She has a past medical history of SVT/atrial tach and is status post ablation and currently on diltiazem.  She has a past medical history of rheumatoid arthritis, has not been on Humira for 1-1/2 months.  She has a history of well-controlled GERD and spinal stenosis.  No history of PE, DVT, hypercoagulability, exogenous estrogen use, pulmonary disease, smoking, MI, coronary disease, hypercholesterolemia, diabetes, hypertension.  PMD: [facility].   She was sent in for evaluation from postoperative clearance with concerns for pneumonia or PE.   Past Medical History:  Diagnosis Date   ADHD (attention  deficit hyperactivity disorder)    Anxiety    Depression    Insomnia    Interstitial cystitis    sees Dr. Patsi Sears    Migraine headache     Past Surgical History:  Procedure Laterality Date   BREAST SURGERY  1995, 2016   D&C  11/05/2018   Polyps removed - pathology okay   Explant capsulectomy     MEDIAL COLLATERAL LIGAMENT REPAIR, KNEE  2017    Family History  Problem Relation Age of Onset   Prostate cancer Father     Social History   Tobacco Use   Smoking status: Never   Smokeless tobacco: Never  Vaping Use   Vaping Use: Never used  Substance Use Topics   Alcohol use: Yes    Alcohol/week: 0.0 standard drinks    Comment: occ   Drug use: No    No current facility-administered medications for this encounter.  Current Outpatient Medications:    amoxicillin-clavulanate (AUGMENTIN) 875-125 MG tablet, Take 1 tablet by mouth 2 (two) times daily. X 7 days, Disp: 14 tablet, Rfl: 0   chlorpheniramine-HYDROcodone (TUSSIONEX PENNKINETIC ER) 10-8 MG/5ML SUER, Take 5 mLs by mouth every 12 (twelve) hours as needed for cough., Disp: 60 mL, Rfl: 0   fluticasone (FLONASE) 50 MCG/ACT nasal spray, Place 2 sprays into both nostrils daily., Disp: 16 g, Rfl: 0   ibuprofen (ADVIL) 600 MG tablet, Take 1 tablet (600 mg total) by mouth every 6 (six) hours as needed., Disp: 30 tablet, Rfl: 0   levalbuterol (XOPENEX HFA)  45 MCG/ACT inhaler, Inhale 1-2 puffs into the lungs every 6 (six) hours as needed for wheezing or shortness of breath., Disp: 1 each, Rfl: 12   Spacer/Aero-Holding Chambers (AEROCHAMBER PLUS) inhaler, Use with inhaler, Disp: 1 each, Rfl: 2   busPIRone (BUSPAR) 7.5 MG tablet, Take 7.5 mg by mouth 3 (three) times daily., Disp: , Rfl:    diltiazem (CARDIZEM CD) 180 MG 24 hr capsule, Take 180 mg by mouth daily., Disp: , Rfl:    escitalopram (LEXAPRO) 5 MG tablet, Take 5 mg by mouth daily., Disp: , Rfl:    HUMIRA PEN 40 MG/0.4ML PNKT, SMARTSIG:40 Milligram(s) SUB-Q Every 2 Weeks,  Disp: , Rfl:    LORazepam (ATIVAN) 1 MG tablet, Take 1 tablet (1 mg total) by mouth daily as needed for anxiety., Disp: 20 tablet, Rfl: 0   Lutein 40 MG CAPS, Take by mouth., Disp: , Rfl:    MAGNESIUM PO, Take 450 mg by mouth., Disp: , Rfl:    predniSONE (DELTASONE) 5 MG tablet, Take by mouth., Disp: , Rfl:    TURMERIC PO, Take 1,200 mg by mouth 2 (two) times daily. , Disp: , Rfl:    zolmitriptan (ZOMIG) 5 MG tablet, Take 1 tablet (5 mg total) by mouth as needed for migraine., Disp: 27 tablet, Rfl: 3  Allergies  Allergen Reactions   Oxycodone Rash     ROS  As noted in HPI.   Physical Exam  BP 137/82 (BP Location: Right Arm)   Pulse 67   Temp 98.7 F (37.1 C) (Oral)   Resp 18   LMP 06/18/2021 (Approximate)   SpO2 98%   Constitutional: Well developed, well nourished, coughing.  Speaking in full sentences. Eyes:  EOMI, conjunctiva normal bilaterally HENT: Normocephalic , atraumatic,mucus membranes moist.  Positive maxillary sinus tenderness, nasal congestion, swollen, erythematous turbinates.  No postnasal drip. Respiratory: Normal inspiratory effort, lungs clear bilaterally.  Positive chest tenderness at the sternal junction. Cardiovascular: Normal rate, regular rhythm, no murmurs rubs or gallop GI: nondistended skin: No rash, skin intact Musculoskeletal: no deformities, calves symmetric, nontender no edema Neurologic: Alert & oriented x 3, no focal neuro deficits Psychiatric: Speech and behavior appropriate   ED Course   Medications - No data to display  Orders Placed This Encounter  Procedures   DG Chest 2 View    Standing Status:   Standing    Number of Occurrences:   1    Order Specific Question:   Reason for Exam (SYMPTOM  OR DIAGNOSIS REQUIRED)    Answer:   recent covid r/o PNA   Basic metabolic panel    Standing Status:   Standing    Number of Occurrences:   1   D-dimer, quantitative    Standing Status:   Standing    Number of Occurrences:   1   ED EKG     Standing Status:   Standing    Number of Occurrences:   1    Order Specific Question:   Reason for Exam    Answer:   Shortness of breath   EKG 12-Lead    Standing Status:   Standing    Number of Occurrences:   1    No results found for this or any previous visit (from the past 24 hour(s)).  DG Chest 2 View  Result Date: 07/17/2021 CLINICAL DATA:  History of COVID positive with cough and shortness of breath. EXAM: CHEST - 2 VIEW COMPARISON:  September 07, 2020 FINDINGS: The heart size and  mediastinal contours are within normal limits. Both lungs are clear. The visualized skeletal structures are unremarkable. IMPRESSION: No active cardiopulmonary disease. Electronically Signed   By: Aram Candela M.D.   On: 07/17/2021 17:43    Results for orders placed or performed during the hospital encounter of 07/17/21  Basic metabolic panel  Result Value Ref Range   Sodium 134 (L) 135 - 145 mmol/L   Potassium 3.9 3.5 - 5.1 mmol/L   Chloride 104 98 - 111 mmol/L   CO2 26 22 - 32 mmol/L   Glucose, Bld 87 70 - 99 mg/dL   BUN 17 6 - 20 mg/dL   Creatinine, Ser 2.22 0.44 - 1.00 mg/dL   Calcium 8.9 8.9 - 97.9 mg/dL   GFR, Estimated >89 >21 mL/min   Anion gap 4 (L) 5 - 15  D-dimer, quantitative  Result Value Ref Range   D-Dimer, Quant 0.30 0.00 - 0.50 ug/mL-FEU    ED Clinical Impression  1. Cough   2. COVID-19 virus infection   3. Acute non-recurrent maxillary sinusitis   4. Shortness of breath      ED Assessment/Plan  Differential for cough/shortness of breath includes sinusitis, pneumonia, residual inflammation from COVID, bronchospasm, PE.  Concerned that she would not mount a tachycardic response due to being on Cardizem.  Doubt CHF, ACS.  Will check chest x-ray, EKG, BMP and a D-dimer.  We will contact patient at [phone] tomorrow if D-dimer is positive and she will go to the ED for CT angio of the chest.  EKG: Normal sinus rhythm, rate 60.  Normal axis, normal intervals.  No  hypertrophy.  No ST-T wave changes compared to EKG from 08/2020.   [place] Narcotic database reviewed for this patient, and feel that the risk/benefit ratio today is favorable for proceeding with a prescription for controlled substance.  Short course of tramadol on 8/10.  She is also prescribed Ambien.  Reviewed imaging independently.  Normal chest x-ray. see radiology report for full details.  EKG, chest x-ray reassuring.  She does have some chest wall tenderness from all the coughing.  However, she also has nasal congestion, maxillary sinus tenderness, so will send home with Augmentin, Flonase for a presumed sinus infection which may be contributing to her cough.  will send home with Xopenex and a spacer.  Xopenex because of the history of atrial tachycardia/SVT.  Tussionex for the cough.  Advised her to not take Ambien while taking Tussionex.  Follow-up with PMD in 2 to 3 days, strict ER return precautions given.  D-dimer negative.  Presentation consistent with sinusitis, chest soreness from coughing and shortness of breath from perhaps some residual inflammation from recent COVID infection.  Plan as above.  Discussed labs, imaging, MDM, treatment plan, and plan for follow-up with patient. Discussed sn/sx that should prompt return to the ED. patient agrees with plan.   Meds ordered this encounter  Medications   levalbuterol (XOPENEX HFA) 45 MCG/ACT inhaler    Sig: Inhale 1-2 puffs into the lungs every 6 (six) hours as needed for wheezing or shortness of breath.    Dispense:  1 each    Refill:  12   chlorpheniramine-HYDROcodone (TUSSIONEX PENNKINETIC ER) 10-8 MG/5ML SUER    Sig: Take 5 mLs by mouth every 12 (twelve) hours as needed for cough.    Dispense:  60 mL    Refill:  0   ibuprofen (ADVIL) 600 MG tablet    Sig: Take 1 tablet (600 mg total) by mouth every  6 (six) hours as needed.    Dispense:  30 tablet    Refill:  0   Spacer/Aero-Holding Chambers (AEROCHAMBER PLUS) inhaler    Sig: Use  with inhaler    Dispense:  1 each    Refill:  2    Please educate patient on use   fluticasone (FLONASE) 50 MCG/ACT nasal spray    Sig: Place 2 sprays into both nostrils daily.    Dispense:  16 g    Refill:  0   amoxicillin-clavulanate (AUGMENTIN) 875-125 MG tablet    Sig: Take 1 tablet by mouth 2 (two) times daily. X 7 days    Dispense:  14 tablet    Refill:  0       *This clinic note was created using Scientist, clinical (histocompatibility and immunogenetics). Therefore, there may be occasional mistakes despite careful proofreading.  ?    Domenick Gong, MD 07/18/21 1751

## 2021-07-17 NOTE — ED Triage Notes (Signed)
Pt presents today with c/o cough and shortness since being dx with Covid on 07/01/21. She was seen today by Anesthesiologist for an upcoming back surgery (canceled d/t symptoms who suggested she come here for eval. [facility] [place] [facility] this [facility].

## 2021-08-02 ENCOUNTER — Other Ambulatory Visit: Payer: Self-pay | Admitting: Obstetrics and Gynecology

## 2021-08-02 DIAGNOSIS — Z1231 Encounter for screening mammogram for malignant neoplasm of breast: Secondary | ICD-10-CM

## 2021-08-06 ENCOUNTER — Other Ambulatory Visit: Payer: Self-pay

## 2021-08-06 ENCOUNTER — Ambulatory Visit
Admission: RE | Admit: 2021-08-06 | Discharge: 2021-08-06 | Disposition: A | Discharge status: Home / Self Care | Payer: No Typology Code available for payment source | Source: Ambulatory Visit | Attending: Obstetrics and Gynecology | Admitting: Obstetrics and Gynecology

## 2021-08-06 DIAGNOSIS — Z1231 Encounter for screening mammogram for malignant neoplasm of breast: Secondary | ICD-10-CM | POA: Insufficient documentation

## 2021-08-14 ENCOUNTER — Ambulatory Visit: Payer: No Typology Code available for payment source | Admitting: Cardiovascular Disease

## 2021-08-14 ENCOUNTER — Encounter: Payer: Self-pay | Admitting: Cardiovascular Disease

## 2021-08-14 ENCOUNTER — Other Ambulatory Visit: Payer: Self-pay

## 2021-08-14 VITALS — BP 140/80 | HR 79 | Ht 67.0 in | Wt 147.1 lb

## 2021-08-14 DIAGNOSIS — R079 Chest pain, unspecified: Secondary | ICD-10-CM | POA: Diagnosis not present

## 2021-08-14 DIAGNOSIS — F411 Generalized anxiety disorder: Secondary | ICD-10-CM

## 2021-08-14 DIAGNOSIS — I471 Supraventricular tachycardia: Secondary | ICD-10-CM

## 2021-08-14 DIAGNOSIS — U099 Post covid-19 condition, unspecified: Secondary | ICD-10-CM | POA: Insufficient documentation

## 2021-08-14 DIAGNOSIS — U071 COVID-19: Secondary | ICD-10-CM

## 2021-08-14 DIAGNOSIS — R0602 Shortness of breath: Secondary | ICD-10-CM

## 2021-08-14 DIAGNOSIS — I479 Paroxysmal tachycardia, unspecified: Secondary | ICD-10-CM | POA: Insufficient documentation

## 2021-08-14 MED ORDER — DILTIAZEM HCL 30 MG PO TABS: 30.0000 mg | ORAL_TABLET | Freq: Three times a day (TID) | ORAL | 3 refills | Status: DC | PRN

## 2021-08-14 MED ORDER — FLECAINIDE ACETATE 100 MG PO TABS: 100.0000 mg | ORAL_TABLET | ORAL | 3 refills | Status: DC | PRN

## 2021-08-14 MED ORDER — NEBIVOLOL HCL 5 MG PO TABS: ORAL_TABLET | ORAL | 3 refills | Status: DC

## 2021-08-14 NOTE — Progress Notes (Addendum)
Cardiology Office Note  Date:  08/15/2021   ID:  Martha [name], DOB [birth year], MRN [id number]  PCP:  Mick Sell, MD   Chief Complaint  Patient presents with   Self Ref     Establish care for chest pain, shortness of breath and dizziness. Medications reviewed by the patient verbally.     HPI:  [name] is a 50 year old woman with past medical history of Former smoker Atrial tachycardia Rheumatoid arthritis Chronic pain COVID 16 July 2021, treated with PACs elevated Who presents by referral from Dr. Karna Christmas for chronic chest pain, paroxysmal tachycardia  October 2021 in [place] for her daughter's wedding, some chest fullness and palpitations, near syncope In and out of the emergency room [facility], [facility], PACs noted Followed by [facility] cardiology, initial visit November 2021 Driving to work had dizziness, chest tightness, near syncope, reported had been going on and off for 1 year or more Seen in the emergency room for palpitations, near syncope at that time Started on metoprolol 12.5 twice daily, did not get relief Notes indicating lots of stress, grandmother's death, daughter's wedding Essentially normal echocardiogram August 29, 2020, ordered for palpitations, PVCs Cardiac CTA essentially normal Monitor done at [facility], results not available  Seen by EP September 26, 2020, PACs,  Started on flecainide 50 twice daily Repeat monitor showing patient triggered events not associated with significant arrhythmia Felt to have atrial tachycardia in follow-up September 21, 2020 at [facility] Although monitor appeared tachycardia had been suppressed Repeat 48-hour monitor ordered as she reported she had not exerted herself to induce symptoms, no significant arrhythmia  Went for ablation of her atrial tachycardia October 05, 2020 Had no inducible SVT, felt to have atrial tachycardia based on prior Holter No other inducible arrhythmia on isoproterenol  Continued complaints of  tachycardia, palpitations, was started on diltiazem with some improvement of her symptoms Dose up to 180 daily because of fatigue, back to 120 daily  Work-up with pulmonary for shortness of breath symptoms including CT scans, PFTs were unremarkable  Cardiology office visit February 16, 2021 Numerous episodes of SVT 4-5 beats at a time, Sinus tachycardia noted Repeat 48-hour monitor was ordered Does not appear that she has had cardiology follow-up at [facility] since that time  Unclear reasons for transitioning to [facility] heart care in [place], [place] we are out of network, initially requested to see EP, per her insurance need to see general cardiology first  Reports having Chronic chest pain x 1 year  Will wake with it,  Being treated for RA, Humara started 6 months ago  Initial RA diagnosis made 2 years ago, presenting symptoms dry eyes,   CT chest March 2022 No PE, images pulled up and reviewed no significant coronary calcification, no aortic atherosclerosis  Recent events; Symptoms from COVID on meeting with primary care July 11, 2021 reported cough, chest tightness, burning Treated with prednisone, inhaler, azithromycin Reports having heart rate 160s post covid, also chest pain  Continues to have tachycardia on exertion perhaps worse after COVID Back on flecainide 1 1/2 wks ago, stopped diltiazem Sx are doubled, chest pain, SOB, feels medications made it worse Now not on any medication  EKG personally reviewed by myself on todays visit Normal sinus rhythm rate 79 bpm no significant ST-T wave changes  Echocardiogram July 23, 2021 Done at outside facility NORMAL LEFT VENTRICULAR SYSTOLIC FUNCTION NORMAL RIGHT VENTRICULAR SYSTOLIC FUNCTION MILD VALVULAR REGURGITATION (See above) NO VALVULAR STENOSIS ESTIMATED LVEF >55% Aortic: NORMAL Mitral: MILD MR  Tricuspid: MILD TR (2.59m/s) Pulmonic: TRIVIAL PI  PMH:   has a past medical history of ADHD (attention deficit  hyperactivity disorder), Anxiety, Atrial tachycardia (HCC), Depression, Insomnia, Interstitial cystitis, Migraine headache, and Rheumatoid arthritis (HCC).  PSH:    Past Surgical History:  Procedure Laterality Date   BREAST SURGERY  1995, 2016   CARDIAC ELECTROPHYSIOLOGY STUDY AND ABLATION  09/2020   D&C  11/05/2018   Polyps removed - pathology okay   Explant capsulectomy  2019   implants removed   MEDIAL COLLATERAL LIGAMENT REPAIR, KNEE  2017    Current Outpatient Medications  Medication Sig Dispense Refill   acetaminophen (TYLENOL) 500 MG tablet Take by mouth.     busPIRone (BUSPAR) 7.5 MG tablet Take 7.5 mg by mouth 3 (three) times daily.     diltiazem (CARDIZEM) 30 MG tablet Take 1 tablet (30 mg total) by mouth 3 (three) times daily as needed. 90 tablet 3   flecainide (TAMBOCOR) 100 MG tablet Take 1 tablet (100 mg total) by mouth as needed. 90 tablet 3   HUMIRA PEN 40 MG/0.4ML PNKT SMARTSIG:40 Milligram(s) SUB-Q Every 2 Weeks     LORazepam (ATIVAN) 1 MG tablet Take 1 tablet (1 mg total) by mouth daily as needed for anxiety. 20 tablet 0   MAGNESIUM PO Take 450 mg by mouth.     nebivolol (BYSTOLIC) 5 MG tablet 5-10 mg as needed 60 tablet 3   oxybutynin (DITROPAN) 5 MG tablet Take 5 mg by mouth as needed.     traZODone (DESYREL) 50 MG tablet Take 50 mg by mouth as needed.     TURMERIC PO Take 1,200 mg by mouth 2 (two) times daily.      valACYclovir (VALTREX) 1000 MG tablet Take 500 mg by mouth as needed.     zolpidem (AMBIEN) 10 MG tablet Take 1 tablet by mouth at bedtime.     diltiazem (CARDIZEM CD) 180 MG 24 hr capsule Take 180 mg by mouth daily. (Patient not taking: Reported on 08/14/2021)     zolmitriptan (ZOMIG) 5 MG tablet Take 1 tablet (5 mg total) by mouth as needed for migraine. 27 tablet 3   No current facility-administered medications for this visit.     Allergies:   Hydrocodone-acetaminophen, Other, Hydrocodone, and Oxycodone   Social History:  The patient  reports  that she has never smoked. She has never used smokeless tobacco. She reports that she does not drink alcohol and does not use drugs.   Family History:   family history includes Hypertension in her father; Prostate cancer in her father.    Review of Systems: Review of Systems  Constitutional: Negative.   HENT: Negative.    Respiratory: Negative.    Cardiovascular: Negative.   Gastrointestinal: Negative.   Musculoskeletal: Negative.   Neurological: Negative.   Psychiatric/Behavioral: Negative.    All other systems reviewed and are negative.   PHYSICAL EXAM: VS:  BP 140/80 (BP Location: Right Arm, Patient Position: Sitting, Cuff Size: Normal)   Pulse 79   Ht 5\' 7"  (1.702 m)   Wt 147 lb 2 oz (66.7 kg)   LMP 08/06/2021   SpO2 99%   BMI 23.04 kg/m  , BMI Body mass index is 23.04 kg/m. GEN: Well nourished, well developed, in no acute distress HEENT: normal Neck: no JVD, carotid bruits, or masses Cardiac: RRR; no murmurs, rubs, or gallops,no edema  Respiratory:  clear to auscultation bilaterally, normal work of breathing GI: soft, nontender, nondistended, + BS MS: no  deformity or atrophy Skin: warm and dry, no rash Neuro:  Strength and sensation are intact Psych: euthymic mood, full affect   Recent Labs: 09/07/2020: Hemoglobin 12.7; Platelets 189 07/17/2021: BUN 17; Creatinine, Ser 0.70; Potassium 3.9; Sodium 134    Lipid Panel Lab Results  Component Value Date   CHOL 163 11/05/2017   HDL 74.00 11/05/2017   LDLCALC 80 11/05/2017   TRIG 43.0 11/05/2017      Wt Readings from Last 3 Encounters:  08/14/21 147 lb 2 oz (66.7 kg)  07/01/21 139 lb 15.9 oz (63.5 kg)  09/07/20 140 lb (63.5 kg)      ASSESSMENT AND PLAN:  Problem List Items Addressed This Visit     Anxiety state   Relevant Medications   traZODone (DESYREL) 50 MG tablet   Other Visit Diagnoses     Atrial tachycardia (HCC)    -  Primary   Relevant Medications   diltiazem (CARDIZEM) 30 MG tablet    flecainide (TAMBOCOR) 100 MG tablet   nebivolol (BYSTOLIC) 5 MG tablet   Other Relevant Orders   EKG 12-Lead (Completed)   COVID-19       Relevant Medications   valACYclovir (VALTREX) 1000 MG tablet   Chest pain of uncertain etiology       Relevant Orders   EKG 12-Lead (Completed)      Paroxysmal tachycardia Followed by EP at [facility] Attempt at ablation in the past unsuccessful Has been on and off flecainide, on and off beta-blockers, on and off diltiazem Rare very short tachycardia noted on prior monitoring Notes indicating prior patient triggered events not associated with significant arrhythmia Symptoms likely made worse recently following COVID -Off diltiazem and flecainide as she feels it made her symptoms worse but she was tolerating this well before -Recommend she try alternate new generation beta-blocker, try Bystolic 5 mg with titration up to 10 mg if tolerated and as needed.  Mention she does not need to take Bystolic daily if she feels well, could even take this as needed -Could try diltiazem 30 mg or propranolol 10-20 as needed for breakthrough tachypalpitations prior to exertion -Needs to be patient following COVID in recovery -Has had numerous 48-hour and 2-week monitors, does not appear that substantial arrhythmia has been documented but we do not have all records -- Recommend use of flecainide with short acting diltiazem as needed for rates greater than 150 that are sustained  Chest pain Atypical in nature, nonischemic, no further work-up needed -In the setting of RA, unable to exclude inflammatory process -If symptoms persist, could try a trial of colchicine, unable to exclude other etiologies such as pericarditis.  We will avoid steroids  Anxiety Recommend follow-up with Dr. Sampson Goon May be playing a role in symptoms above  Rheumatoid arthritis On Humira, managed by rheumatology  Shortness of breath Atypical, unrelated to arrhythmia Recommended low-grade  exercise, consider low-dose beta-blocker prior to exertion   Total encounter time more than 60 minutes  Greater than 50% was spent in counseling and coordination of care with the patient    Signed, Dossie Arbour, M.D., Ph.D. [facility] [place], [place] [phone]

## 2021-08-14 NOTE — Patient Instructions (Addendum)
Medication Instructions:  Take flecainide 100 mg  Can take with diltiazem 30 mg  1-2 tabs for rapid tachycardia (rate >180) as needed  Bystolic  5mg  to 10 mg daily as needed  before heavy exertion  or rate sustained >160   If you need a refill on your cardiac medications before your next appointment, please call your pharmacy.   Lab work: No new labs needed  Testing/Procedures: No new testing needed  Follow-Up: At [facility], you and your health needs are our priority.  As part of our continuing mission to provide you with exceptional heart care, we have created designated Provider Care Teams.  These Care Teams include your primary Cardiologist (physician) and Advanced Practice Providers (APPs -  Physician Assistants and Nurse Practitioners) who all work together to provide you with the care you need, when you need it.  You will need a follow up appointment as needed  Providers on your designated Care Team:   [facility], NP Nicolasa Ducking, PA-C Cadence Eula Listen, Fransico Michael  COVID-19 Vaccine Information can be found at: [place] For questions related to vaccine distribution or appointments, please email [email or web address] or call [phone].

## 2021-08-15 ENCOUNTER — Telehealth: Payer: Self-pay

## 2021-08-15 NOTE — Telephone Encounter (Signed)
Prior Authorization sent to plan for Martha [name] Key: [id number] - PA Case ID: [id number] - Rx #: [id number].  Waiting for approval.

## 2021-08-20 ENCOUNTER — Telehealth: Payer: Self-pay

## 2021-08-20 NOTE — Telephone Encounter (Signed)
[employer] [facility] [place] sent fax Denial for Bysotlic 5-10 mg PRN Daily  Fax [employer]  member complaint & appeal form to appeal denial for [employer] on approval

## 2021-08-27 ENCOUNTER — Other Ambulatory Visit: Payer: Self-pay

## 2021-08-27 MED ORDER — NEBIVOLOL HCL 5 MG PO TABS: ORAL_TABLET | ORAL | 3 refills | Status: DC

## 2022-03-08 ENCOUNTER — Emergency Department: Payer: No Typology Code available for payment source

## 2022-03-08 ENCOUNTER — Emergency Department
Admission: EM | Admit: 2022-03-08 | Discharge: 2022-03-08 | Disposition: A | Discharge status: Home / Self Care | Payer: No Typology Code available for payment source | Attending: Emergency Medicine | Admitting: Emergency Medicine

## 2022-03-08 ENCOUNTER — Other Ambulatory Visit: Payer: Self-pay

## 2022-03-08 ENCOUNTER — Encounter: Payer: Self-pay | Admitting: Emergency Medicine

## 2022-03-08 DIAGNOSIS — Z8616 Personal history of COVID-19: Secondary | ICD-10-CM | POA: Diagnosis not present

## 2022-03-08 DIAGNOSIS — M51369 Other intervertebral disc degeneration, lumbar region without mention of lumbar back pain or lower extremity pain: Secondary | ICD-10-CM

## 2022-03-08 DIAGNOSIS — M5136 Other intervertebral disc degeneration, lumbar region: Secondary | ICD-10-CM

## 2022-03-08 DIAGNOSIS — M5126 Other intervertebral disc displacement, lumbar region: Secondary | ICD-10-CM | POA: Insufficient documentation

## 2022-03-08 DIAGNOSIS — R1110 Vomiting, unspecified: Secondary | ICD-10-CM | POA: Diagnosis not present

## 2022-03-08 DIAGNOSIS — M5441 Lumbago with sciatica, right side: Secondary | ICD-10-CM

## 2022-03-08 DIAGNOSIS — M545 Low back pain, unspecified: Secondary | ICD-10-CM | POA: Diagnosis present

## 2022-03-08 LAB — CBC
HCT: 36.1 % (ref 36.0–46.0)
Hemoglobin: 12.8 g/dL (ref 12.0–15.0)
MCH: 31.1 pg (ref 26.0–34.0)
MCHC: 35.5 g/dL (ref 30.0–36.0)
MCV: 87.6 fL (ref 80.0–100.0)
Platelets: 166 10*3/uL (ref 150–400)
RBC: 4.12 MIL/uL (ref 3.87–5.11)
RDW: 11.8 % (ref 11.5–15.5)
WBC: 15.3 10*3/uL — ABNORMAL HIGH (ref 4.0–10.5)
nRBC: 0 % (ref 0.0–0.2)

## 2022-03-08 LAB — COMPREHENSIVE METABOLIC PANEL
ALT: 22 U/L (ref 0–44)
AST: 30 U/L (ref 15–41)
Albumin: 4.2 g/dL (ref 3.5–5.0)
Alkaline Phosphatase: 39 U/L (ref 38–126)
Anion gap: 10 (ref 5–15)
BUN: 15 mg/dL (ref 6–20)
CO2: 21 mmol/L — ABNORMAL LOW (ref 22–32)
Calcium: 9.3 mg/dL (ref 8.9–10.3)
Chloride: 106 mmol/L (ref 98–111)
Creatinine, Ser: 0.78 mg/dL (ref 0.44–1.00)
GFR, Estimated: >60 mL/min (ref >60)
Glucose, Bld: 102 mg/dL — ABNORMAL HIGH (ref 70–99)
Potassium: 4 mmol/L (ref 3.5–5.1)
Sodium: 137 mmol/L (ref 135–145)
Total Bilirubin: 0.9 mg/dL (ref 0.3–1.2)
Total Protein: 7.5 g/dL (ref 6.5–8.1)

## 2022-03-08 MED ORDER — ONDANSETRON HCL 4 MG PO TABS: 4.0000 mg | ORAL_TABLET | Freq: Every day | ORAL | 0 refills | Status: AC | PRN

## 2022-03-08 MED ORDER — ONDANSETRON HCL 4 MG PO TABS: 4.0000 mg | ORAL_TABLET | Freq: Every day | ORAL | 1 refills | Status: DC | PRN

## 2022-03-08 MED ORDER — PREDNISONE 50 MG PO TABS: ORAL_TABLET | ORAL | 0 refills | Status: DC

## 2022-03-08 MED ORDER — HYDROMORPHONE HCL 1 MG/ML IJ SOLN
1.0000 mg | Freq: Once | INTRAMUSCULAR | Status: AC
  Administered 2022-03-08: 1 mg via INTRAVENOUS
  Filled 2022-03-08: qty 1

## 2022-03-08 MED ORDER — OXYCODONE HCL 5 MG PO TABS: 5.0000 mg | ORAL_TABLET | Freq: Three times a day (TID) | ORAL | 0 refills | Status: DC | PRN

## 2022-03-08 MED ORDER — OXYCODONE HCL 5 MG PO TABS: 5.0000 mg | ORAL_TABLET | Freq: Three times a day (TID) | ORAL | 0 refills | Status: AC | PRN

## 2022-03-08 MED ORDER — POLYETHYLENE GLYCOL 3350 17 G PO PACK
17.0000 g | PACK | Freq: Every day | ORAL | 0 refills | Status: DC
Start: 2022-03-08 — End: 2022-03-08

## 2022-03-08 MED ORDER — IOHEXOL 300 MG/ML  SOLN
100.0000 mL | Freq: Once | INTRAMUSCULAR | Status: AC | PRN
  Administered 2022-03-08: 100 mL via INTRAVENOUS
  Filled 2022-03-08: qty 100

## 2022-03-08 MED ORDER — POLYETHYLENE GLYCOL 3350 17 G PO PACK: 17.0000 g | PACK | Freq: Every day | ORAL | 0 refills | Status: DC

## 2022-03-08 NOTE — Discharge Instructions (Signed)
Your pain in your groin and back is probably from a bulging disc in your lumbar vertebrae.  Please take Tylenol Motrin for pain.  You can take the oxycodone as needed for breakthrough pain.  I would take MiraLAX if you are taking the oxycodone.  Do not drive if you are taking the opiate.  ? ?Have also prescribed you a week of prednisone which may help with inflammation related to your bulging disc. ?

## 2022-03-08 NOTE — ED Triage Notes (Signed)
Pt arrives via ems from home, pt states she has been having ongoing right groin issues, states aching this week, has been seeing a neuro surgeon who diagnosed nerve impingement. Pt states today is the worst pain she's ever had ?

## 2022-03-08 NOTE — ED Provider Notes (Signed)
? ?[facility] ?Provider Note ? ? ? Event Date/Time  ? First MD Initiated Contact with Patient 03/08/22 1426   ?  (approximate) ? ? ?History  ? ?Groin Pain ? ? ?HPI ? ?Martha [name] is a 51 y.o. female past medical history of rheumatoid arthritis, interstitial cystitis, atrial tachycardia presents with groin pain.  Patient notes that for several months she has had pain in her lower back and right groin but today it is much more severe.  The pain was in the inguinal region radiates down the entire leg she also feels numb in the right leg which is new denies bowel or bladder incontinence denies abdominal pain dysuria fevers chills did have some vomiting today due to severity of pain.  She took a tramadol but vomited it.  She has spinal stenosis and has been told she needed surgery before. ?  ? ?Past Medical History:  ?Diagnosis Date  ? ADHD (attention deficit hyperactivity disorder)   ? Anxiety   ? Atrial tachycardia (HCC)   ? Depression   ? Insomnia   ? Interstitial cystitis   ? sees Dr. Patsi Sears   ? Migraine headache   ? Rheumatoid arthritis (HCC)   ? ? ?Patient Active Problem List  ? Diagnosis Date Noted  ? Atrial tachycardia (HCC) 08/14/2021  ? Paroxysmal tachycardia (HCC) 08/14/2021  ? 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 Burning with urination 12/17/2019  ? Controlled substance agreement signed 12/08/2018  ? Migraine headache 11/23/2014  ? Insomnia 02/27/2009  ? Anxiety state 12/01/2007  ? Depression 12/01/2007  ? Attention deficit hyperactivity disorder (ADHD) 12/01/2007  ? ALLERGIC RHINITIS 12/01/2007  ? ? ? ?Physical Exam  ?Triage Vital Signs: ?ED Triage Vitals  ?Enc Vitals Group  ?   BP 03/08/22 1347 140/77  ?   Pulse Rate 03/08/22 1347 94  ?   Resp 03/08/22 1347 18  ?   Temp 03/08/22 1347 99.7 ?F (37.6 ?C)  ?   Temp Source 03/08/22 1347 Oral  ?   SpO2 03/08/22 1347 99 %  ?   Weight 03/08/22 1346 140 lb (63.5 kg)  ?   Height 03/08/22 1346 5\' 7"  (1.702 m)  ?   Head Circumference --    ?   Peak Flow --   ?   Pain Score 03/08/22 1356 8  ?   Pain Loc --   ?   Pain Edu? --   ?   Excl. in GC? --   ? ? ?Most recent vital signs: ?Vitals:  ? 03/08/22 1347  ?BP: 140/77  ?Pulse: 94  ?Resp: 18  ?Temp: 99.7 ?F (37.6 ?C)  ?SpO2: 99%  ? ? ? ?General: Awake, patient appears uncomfortable ?CV:  Good peripheral perfusion.  ?Resp:  Normal effort.  ?Abd:  No distention.  Abdomen is soft and nontender throughout, no palpable inguinal or femoral hernia ?Neuro:             Awake, Alert, Oriented x 3  ?Other:  Mild tenderness to palpation in the right inguinal region and down the right entire leg.  Patient's foot with hip flexion is somewhat limited but she has full strength with plantarflexion dorsiflexion ?Sensation grossly intact in bilateral lower extremities ?Tenderness to palpation in the lumbar midline and right SI region ? ? ?ED Results / Procedures / Treatments  ?Labs ?(all labs ordered are listed, but only abnormal results are displayed) ?Labs Reviewed  ?CBC - Abnormal; Notable for the following components:  ?  Result Value  ? WBC 15.3 (*)   ? All other components within normal limits  ?COMPREHENSIVE METABOLIC PANEL - Abnormal; Notable for the following components:  ? CO2 21 (*)   ? Glucose, Bld 102 (*)   ? All other components within normal limits  ? ? ? ?EKG ? ? ? ? ?RADIOLOGY ? ? ? ?PROCEDURES: ? ?Critical Care performed: No ? ?Procedures ? ? ?MEDICATIONS ORDERED IN ED: ?Medications  ?HYDROmorphone (DILAUDID) injection 1 mg (1 mg Intravenous Given 03/08/22 1514)  ?iohexol (OMNIPAQUE) 300 MG/ML solution 100 mL (100 mLs Intravenous Contrast Given 03/08/22 1607)  ? ? ? ?IMPRESSION / MDM / ASSESSMENT AND PLAN / ED COURSE  ?I reviewed the triage vital signs and the nursing notes. ?             ?               ? ?Differential diagnosis includes, but is not limited to, lumbar radiculopathy, lateral femoral cutaneous nerve syndrome, psoas abscess, appendicitis, ovarian pathology ? ?Patient is a 51 year old female  presents with groin pain.  Notes that she has had this pain for some time but is much more severe today is now radiating down the entire leg and is associated with some numbness.  She does have chronic back pain as well.  No bowel bladder incontinence no fevers did have some episodes of vomiting today associated with pain.  Patient appears uncomfortable on my evaluation.  She has no palpable hernia in the inguinal region but is tender there, her abdomen is benign is nontender in the right lower quadrant.  She does have right SI joint tenderness as well as midline lumbar tenderness.  Strength testing somewhat limited secondary to pain does not put much effort into hip flexion but does have full strength with plantarflexion dorsiflexion.  Differential remains broad suspect that because his pain has been going on some time that an acute intra-abdominal process is unlikely and that this is more likely related to nerve entrapment or lumbar radiculopathy.  We will start with a CT of the abdomen pelvis and treat her pain with IV Dilaudid.  May need to obtain an MRI if no findings on CT. ? ?Patient CT of her abdomen pelvis is essentially negative for intra-abdominal pathology.  There is GAD at L4 and L5 with a bulging disc.  On reassessment patient's pain is much more controlled she is able to ambulate but with a limp and still feels subjectively weak but her objective strength is normal in that leg now that pain is better controlled..  We discussed getting an MRI in the ED as opposed to neurosurgery follow-up and she preferred to get the MRI now I think this is reasonable given that her symptoms seem to have progressed quickly. ?  ? ?MRI of the lumbar spine shows moderate right foraminal stenosis and possible impingement on the right L5 nerve root but no signs of cauda equina no central disc herniation.  Clinically patient does not have cauda equina either.  Given her pain is likely related to herniated disc will prescribe  7 days of steroids.  Recommended Tylenol and ibuprofen as well as oxycodone prescribed as needed for breakthrough pain.  She has follow-up with her neurosurgeon already later this month.  We discussed return for bowel bladder incontinence saddle anesthesia progressive weakness etc. ? ?FINAL CLINICAL IMPRESSION(S) / ED DIAGNOSES  ? ?Final diagnoses:  ?L4-L5 disc bulge  ?Right-sided low back pain with right-sided sciatica, unspecified chronicity  ? ? ? ?  Rx / DC Orders  ? ?ED Discharge Orders   ? ?      Ordered  ?  polyethylene glycol (MIRALAX) 17 g packet  Daily       ? 03/08/22 1834  ?  oxyCODONE (ROXICODONE) 5 MG immediate release tablet  Every 8 hours PRN       ? 03/08/22 1834  ?  predniSONE (DELTASONE) 50 MG tablet       ? 03/08/22 1834  ? ?  ?  ? ?  ? ? ? ?Note:  This document was prepared using Dragon voice recognition software and may include unintentional dictation errors. ?  ?Georga Hacking, MD ?03/08/22 1835 ? ?

## 2022-03-09 ENCOUNTER — Telehealth: Payer: Self-pay | Admitting: Emergency Medicine

## 2022-03-09 MED ORDER — TRAMADOL HCL 50 MG PO TABS: 50.0000 mg | ORAL_TABLET | Freq: Four times a day (QID) | ORAL | 0 refills | Status: AC | PRN

## 2022-03-09 NOTE — Telephone Encounter (Signed)
I saw this patient yesterday.  Patient called back asking for alternative to oxycodone as she has had rash in this in the past.  Has had rash to hydrocodone as well but tolerated tramadol so we will do this. ?

## 2022-03-27 ENCOUNTER — Emergency Department
Admission: EM | Admit: 2022-03-27 | Discharge: 2022-03-27 | Disposition: A | Discharge status: Home / Self Care | Payer: No Typology Code available for payment source | Attending: Emergency Medicine | Admitting: Emergency Medicine

## 2022-03-27 ENCOUNTER — Emergency Department: Payer: No Typology Code available for payment source

## 2022-03-27 ENCOUNTER — Encounter: Payer: Self-pay | Admitting: Emergency Medicine

## 2022-03-27 DIAGNOSIS — R0602 Shortness of breath: Secondary | ICD-10-CM | POA: Diagnosis not present

## 2022-03-27 DIAGNOSIS — R079 Chest pain, unspecified: Secondary | ICD-10-CM | POA: Insufficient documentation

## 2022-03-27 DIAGNOSIS — R002 Palpitations: Secondary | ICD-10-CM | POA: Diagnosis not present

## 2022-03-27 DIAGNOSIS — Z8679 Personal history of other diseases of the circulatory system: Secondary | ICD-10-CM

## 2022-03-27 LAB — CBC
HCT: 38.9 % (ref 36.0–46.0)
Hemoglobin: 12.9 g/dL (ref 12.0–15.0)
MCH: 30.8 pg (ref 26.0–34.0)
MCHC: 33.2 g/dL (ref 30.0–36.0)
MCV: 92.8 fL (ref 80.0–100.0)
Platelets: 201 10*3/uL (ref 150–400)
RBC: 4.19 MIL/uL (ref 3.87–5.11)
RDW: 13.1 % (ref 11.5–15.5)
WBC: 7.2 10*3/uL (ref 4.0–10.5)
nRBC: 0 % (ref 0.0–0.2)

## 2022-03-27 LAB — URINALYSIS, ROUTINE W REFLEX MICROSCOPIC
Bilirubin Urine: NEGATIVE
Glucose, UA: NEGATIVE mg/dL
Hgb urine dipstick: NEGATIVE
Ketones, ur: NEGATIVE mg/dL
Leukocytes,Ua: NEGATIVE
Nitrite: NEGATIVE
Protein, ur: NEGATIVE mg/dL
Specific Gravity, Urine: 1.001 — ABNORMAL LOW (ref 1.005–1.030)
pH: 7 (ref 5.0–8.0)

## 2022-03-27 LAB — TROPONIN I (HIGH SENSITIVITY)
Troponin I (High Sensitivity): 3 ng/L (ref <18)
Troponin I (High Sensitivity): 4 ng/L (ref <18)

## 2022-03-27 LAB — BASIC METABOLIC PANEL
Anion gap: 6 (ref 5–15)
BUN: 11 mg/dL (ref 6–20)
CO2: 26 mmol/L (ref 22–32)
Calcium: 9.2 mg/dL (ref 8.9–10.3)
Chloride: 107 mmol/L (ref 98–111)
Creatinine, Ser: 0.72 mg/dL (ref 0.44–1.00)
GFR, Estimated: >60 mL/min (ref >60)
Glucose, Bld: 105 mg/dL — ABNORMAL HIGH (ref 70–99)
Potassium: 3.5 mmol/L (ref 3.5–5.1)
Sodium: 139 mmol/L (ref 135–145)

## 2022-03-27 LAB — POC URINE PREG, ED: Preg Test, Ur: NEGATIVE

## 2022-03-27 MED ORDER — NEBIVOLOL HCL 5 MG PO TABS
5.0000 mg | ORAL_TABLET | ORAL | 0 refills | Status: DC | PRN
Start: 2022-03-27 — End: 2022-04-03

## 2022-03-27 NOTE — ED Triage Notes (Signed)
Pt presents via POV with complaints of CP starting a few hours ago. She hx a atrial tachycardia and she has felt that "my heart is racing". She notes being seen at [facility] and has been off her medications for the last 6 months due to doing well. She notes taking ativan PTA thinking it was stress related without improvement. Denies SOB.

## 2022-03-27 NOTE — ED Provider Notes (Signed)
[facility] Provider Note   Event Date/Time   First MD Initiated Contact with Patient 03/27/22 2117     (approximate) History  Chest Pain  HPI Martha [name] is a 51 y.o. female listed past medical history of atrial tachycardia who presents for chest pain, palpitations, shortness of breath that began approximately 12 hours prior to arrival and has resolved since onset.  Patient states that her chest pain has most relocked resolved but is still present at approximately 2/10 in severity.  Patient states that she has had similar symptoms to this in the past when she has had bouts of atrial tachycardia.  Patient states that she is normally on a rescue medication but has not had it in the past few months. ROS: Patient currently denies any vision changes, tinnitus, difficulty speaking, facial droop, sore throat, abdominal pain, nausea/vomiting/diarrhea, dysuria, or weakness/numbness/paresthesias in any extremity   Physical Exam  Triage Vital Signs: ED Triage Vitals  Enc Vitals Group     BP 03/27/22 1956 (!) 152/76     Pulse Rate 03/27/22 1956 82     Resp 03/27/22 1956 18     Temp 03/27/22 1956 98.7 F (37.1 C)     Temp Source 03/27/22 1956 Oral     SpO2 03/27/22 1956 100 %     Weight 03/27/22 1952 140 lb (63.5 kg)     Height 03/27/22 1952 5\' 7"  (1.702 m)     Head Circumference --      Peak Flow --      Pain Score 03/27/22 1951 7     Pain Loc --      Pain Edu? --      Excl. in GC? --    Most recent vital signs: Vitals:   03/27/22 1956 03/27/22 2123  BP: (!) 152/76 (!) 142/69  Pulse: 82 72  Resp: 18 13  Temp: 98.7 F (37.1 C)   SpO2: 100% 100%   General: Awake, oriented x4. CV:  Good peripheral perfusion.  Resp:  Normal effort.  Abd:  No distention.  Other:  Patient is a middle-aged Caucasian female laying in bed in no acute distress ED Results / Procedures / Treatments  Labs (all labs ordered are listed, but only abnormal results are  displayed) Labs Reviewed  BASIC METABOLIC PANEL - Abnormal; Notable for the following components:      Result Value   Glucose, Bld 105 (*)    All other components within normal limits  URINALYSIS, ROUTINE W REFLEX MICROSCOPIC - Abnormal; Notable for the following components:   Color, Urine COLORLESS (*)    APPearance CLEAR (*)    Specific Gravity, Urine 1.001 (*)    All other components within normal limits  CBC  POC URINE PREG, ED  TROPONIN I (HIGH SENSITIVITY)  TROPONIN I (HIGH SENSITIVITY)   EKG ED ECG REPORT I, 2124, the attending physician, personally viewed and interpreted this ECG. Date: 03/27/2022 EKG Time: 1953 Rate: 86 Rhythm: normal sinus rhythm QRS Axis: normal Intervals: normal ST/T Wave abnormalities: normal Narrative Interpretation: no evidence of acute ischemia RADIOLOGY ED MD interpretation: 2 view chest x-ray interpreted by me shows no evidence of acute abnormalities including no pneumonia, pneumothorax, or widened mediastinum -Agree with radiology assessment Official radiology report(s): DG Chest 2 View  Result Date: 03/27/2022 CLINICAL DATA:  Chest pain, tachycardia EXAM: CHEST - 2 VIEW COMPARISON:  07/17/2021 FINDINGS: The heart size and mediastinal contours are within normal limits. Both lungs are clear.  The visualized skeletal structures are unremarkable. IMPRESSION: No active cardiopulmonary disease. Electronically Signed   By: Sharlet Salina M.D.   On: 03/27/2022 20:15   PROCEDURES: Critical Care performed: No Procedures MEDICATIONS ORDERED IN ED: Medications - No data to display IMPRESSION / MDM / ASSESSMENT AND PLAN / ED COURSE  I reviewed the triage vital signs and the nursing notes.                             The patient is on the cardiac monitor to evaluate for evidence of arrhythmia and/or significant heart rate changes. Patient's presentation is most consistent with exacerbation of chronic illness. Signs and symptoms of atrial  tachycardia however EKG does not show any tachycardia at this time and is likely resolved DDx: Pneumothorax, Pneumonia, Pulmonary Embolus, Tamponade, ACS, Thyrotoxicosis.  No history or evidence decompensated heart failure. Given their history and exam it is likely this patient is unlikely to spontaneously revert to a rate controlled rhythm and necessitates a thorough workup for their arrhythmia. Workup: ECG, CXR, CBC, BMP, UA, Troponin Interventions: None needed  Reassessment: Patient maintained NSR during multi-hour observation in ED. Disposition: Discharge home with prompt PCP follow up and cardiology referral.   FINAL CLINICAL IMPRESSION(S) / ED DIAGNOSES   Final diagnoses:  Chest pain, unspecified type  Palpitations  Shortness of breath  History of paroxysmal atrial tachycardia   Rx / DC Orders   ED Discharge Orders          Ordered    nebivolol (BYSTOLIC) 5 MG tablet  As needed        03/27/22 2234           Note:  This document was prepared using Dragon voice recognition software and may include unintentional dictation errors.   Merwyn Katos, MD 04/03/22 [phone]

## 2022-04-02 NOTE — Progress Notes (Unsigned)
Cardiology Office Note  Date:  04/03/2022   ID:  Martha [name], DOB [birth year], MRN [id number]  PCP:  Mick SellFitzgerald, David P, MD   Chief Complaint  Patient presents with   Palpitations    Follow up from [facility] ER; chest pain & palpitations. Medications reviewed by the patient verbally.     HPI:  [name] is a 51 year old woman with past medical history of Former smoker Atrial tachycardia Rheumatoid arthritis Chronic pain COVID 16 July 2021, treated with PACs elevated Who presents for follow-up of her chronic chest pain, paroxysmal tachycardia  Last office visit October 2022 Reports that for quite some time she was doing fine, rarely needed low-dose beta-blocker as needed for tachycardia  Scheduled for back surgery next week Appreciating PAC/PVC, on cardiomobile recorded elevated rates Off humera injection for back surgery Started on prednisone 60 daily Started having worsening tachycardia, episode 5/31 appreciated symptomatic tachycardia, elevated rate, SOB, developed nausea Talked with neurosurgery team, completed a prednisone taper now off   Seen in the emergency room Mar 27, 2022 for chest pain/tachycardia Appreciated chest discomfort, palpitations, shortness of breath Blood pressure mildly elevated in the ER 140-1 50 systolic Bystolic 5 mg was refilled  EKG personally reviewed by myself on todays visit Normal sinus rhythm rate 70 bpm no significant ST-T wave changes  Other past medical history reviewed October 2021 in [place] for her daughter[place] wedding, some chest fullness and palpitations, near syncope In and out of the emergency room [facility], [facility], PACs noted Follow[facility] by [facility] cardiology, initial visit November 2021 Driving to work had dizziness, chest tightness, near syncope, reported had been going on and off for 1 year or more Seen in the emergency room for palpitations, near syncope at that time Started on metoprolol 12.5 twice daily, did not get  relief Notes indicating lots of stress, grandmother's death, daughter's wedding Essentially normal echocardiogram August 29, 2020, ordered for palpitations, PVCs Cardiac CTA essentially normal Monitor [facility], results not available  Seen by EP September 26, 2020, PACs,  Started on flecainide 50 twice daily Repeat monitor showing patient triggered events not associated with significant arrhythmia Felt to have atrial tachycardia in follow-up September 21, 2020 at [facility] Although monitor appeared tachycardia had been suppressed Repeat 48-hour monitor ordered as she reported she had not exerted herself to induce symptoms, no significant arrhythmia  Went for ablation of her atrial tachycardia October 05, 2020 Had no inducible SVT, felt to have atrial tachycardia based on prior Holter No other inducible arrhythmia on isoproterenol  Continued complaints of tachycardia, palpitations, was started on diltiazem with some improvement of her symptoms Dose up to 180 daily because of fatigue, back to 120 daily  Work-up with pulmonary for shortness of breath symptoms including CT scans, PFTs were unremarkable  Cardiology office visit February 16, 2021 Numerous episodes of SVT 4-5 beats at a time, Sinus tachycardia noted Repeat 48-hour monitor was ordered Does not appear that she has had cardiology follow-up at [facility] since that time  Initial RA diagnosis made >2 years ago, presenting symptoms dry eyes,   CT chest March 2022 No PE, images pulled up and reviewed no significant coronary calcification, no aortic atherosclerosis  Symptoms from COVID on meeting with primary care July 11, 2021 reported cough, chest tightness, burning Treated with prednisone, inhaler, azithromycin Reports having heart rate 160s post covid, also chest pain  Continues to have tachycardia on exertion perhaps worse after COVID Back on flecainide 1 1/2 wks ago, stopped diltiazem Sx  are doubled, chest pain, SOB, feels  medications made it worse  Echocardiogram July 23, 2021 Done at outside facility NORMAL LEFT VENTRICULAR SYSTOLIC FUNCTION NORMAL RIGHT VENTRICULAR SYSTOLIC FUNCTION MILD VALVULAR REGURGITATION (See above) NO VALVULAR STENOSIS ESTIMATED LVEF >55% Aortic: NORMAL Mitral: MILD MR Tricuspid: MILD TR (2.62m/s) Pulmonic: TRIVIAL PI  PMH:   has a past medical history of ADHD (attention deficit hyperactivity disorder), Anxiety, Atrial tachycardia (HCC), Depression, Insomnia, Interstitial cystitis, Migraine headache, and Rheumatoid arthritis (HCC).  PSH:    Past Surgical History:  Procedure Laterality Date   BREAST SURGERY  1995, 2016   CARDIAC ELECTROPHYSIOLOGY STUDY AND ABLATION  09/2020   D&C  11/05/2018   Polyps removed - pathology okay   Explant capsulectomy  2019   implants removed   MEDIAL COLLATERAL LIGAMENT REPAIR, KNEE  2017    Current Outpatient Medications  Medication Sig Dispense Refill   acetaminophen (TYLENOL) 500 MG tablet Take by mouth.     busPIRone (BUSPAR) 7.5 MG tablet Take 7.5 mg by mouth 3 (three) times daily.     LORazepam (ATIVAN) 1 MG tablet Take 1 mg by mouth 2 (two) times daily.     MAGNESIUM PO Take 450 mg by mouth.     nebivolol (BYSTOLIC) 5 MG tablet Take 1-2 tablets (5-10 mg total) by mouth as needed. 30 tablet 0   ondansetron (ZOFRAN) 4 MG tablet Take 1 tablet (4 mg total) by mouth daily as needed for nausea or vomiting. 30 tablet 0   polyethylene glycol (MIRALAX) 17 g packet Take 17 g by mouth daily. 14 each 0   traZODone (DESYREL) 50 MG tablet Take 50 mg by mouth as needed.     TURMERIC PO Take 1,200 mg by mouth 2 (two) times daily.      valACYclovir (VALTREX) 1000 MG tablet Take 500 mg by mouth as needed.     zolmitriptan (ZOMIG) 5 MG tablet Take 1 tablet (5 mg total) by mouth as needed for migraine. 27 tablet 3   zolpidem (AMBIEN) 10 MG tablet Take 1 tablet by mouth at bedtime.     HUMIRA PEN 40 MG/0.4ML PNKT SMARTSIG:40 Milligram(s) SUB-Q  Every 2 Weeks (Patient not taking: Reported on 04/03/2022)     oxybutynin (DITROPAN) 5 MG tablet Take 5 mg by mouth as needed. (Patient not taking: Reported on 04/03/2022)     predniSONE (DELTASONE) 50 MG tablet Take one pill daily for 7 days (Patient not taking: Reported on 04/03/2022) 7 tablet 0   No current facility-administered medications for this visit.     Allergies:   Hydrocodone-acetaminophen, Other, Hydrocodone, and Oxycodone   Social History:  The patient  reports that she has never smoked. She has never used smokeless tobacco. She reports that she does not drink alcohol and does not use drugs.   Family History:   family history includes Hypertension in her father; Prostate cancer in her father.    Review of Systems: Review of Systems  Constitutional: Negative.   HENT: Negative.    Respiratory: Negative.    Cardiovascular: Negative.   Gastrointestinal: Negative.   Musculoskeletal: Negative.   Neurological: Negative.   Psychiatric/Behavioral: Negative.    All other systems reviewed and are negative.   PHYSICAL EXAM: VS:  BP 120/70 (BP Location: Left Arm, Patient Position: Sitting, Cuff Size: Normal)   Pulse 70   Ht 5\' 7"  (1.702 m)   Wt 143 lb (64.9 kg)   SpO2 98%   BMI 22.40 kg/m  , BMI Body mass  index is 22.4 kg/m. Constitutional:  oriented to person, place, and time. No distress.  HENT:  Head: Grossly normal Eyes:  no discharge. No scleral icterus.  Neck: No JVD, no carotid bruits  Cardiovascular: Regular rate and rhythm, no murmurs appreciated Pulmonary/Chest: Clear to auscultation bilaterally, no wheezes or rails Abdominal: Soft.  no distension.  no tenderness.  Musculoskeletal: Normal range of motion Neurological:  normal muscle tone. Coordination normal. No atrophy Skin: Skin warm and dry Psychiatric: normal affect, pleasant  Recent Labs: 03/08/2022: ALT 22 03/27/2022: BUN 11; Creatinine, Ser 0.72; Hemoglobin 12.9; Platelets 201; Potassium 3.5; Sodium 139     Lipid Panel Lab Results  Component Value Date   CHOL 163 11/05/2017   HDL 74.00 11/05/2017   LDLCALC 80 11/05/2017   TRIG 43.0 11/05/2017      Wt Readings from Last 3 Encounters:  04/03/22 143 lb (64.9 kg)  03/27/22 140 lb (63.5 kg)  03/08/22 140 lb (63.5 kg)      ASSESSMENT AND PLAN:  Problem List Items Addressed This Visit       Cardiology Problems   Atrial tachycardia (HCC) - Primary     Other   Anxiety state   Relevant Medications   LORazepam (ATIVAN) 1 MG tablet   Other Visit Diagnoses     Chest pain of uncertain etiology       Shortness of breath         Paroxysmal tachycardia Prior work-up with EP at [facility] Attempt at ablation in the past unsuccessful Unable to tolerate flecainide, diltiazem Recently evaluated in the emergency room after episode of tachycardia Back surgery planned for next week  Recommend she start bystolic 2.5 up to 5 mg daily Propranolol 10 up to 20 mg as needed for breakthrough tachycardia  Chest pain Atypical in nature, nonischemic, no further work-up needed In the setting of tachycardia Unable to exclude microvascular disease  Anxiety Recommend follow-up with Dr. Sampson Goon Exacerbated by tachycardia episodes  Rheumatoid arthritis Has been treated with Humira, managed by rheumatology Off Humira for back surgery next week Did short course of prednisone    Total encounter time more than 30 minutes  Greater than 50% was spent in counseling and coordination of care with the patient    Signed, Dossie Arbour, M.D., Ph.D. [facility] [place], [place] [phone]

## 2022-04-03 ENCOUNTER — Ambulatory Visit: Payer: No Typology Code available for payment source | Admitting: Cardiovascular Disease

## 2022-04-03 ENCOUNTER — Encounter: Payer: Self-pay | Admitting: Cardiovascular Disease

## 2022-04-03 VITALS — BP 120/70 | HR 70 | Ht 67.0 in | Wt 143.0 lb

## 2022-04-03 DIAGNOSIS — I471 Supraventricular tachycardia: Secondary | ICD-10-CM | POA: Diagnosis not present

## 2022-04-03 DIAGNOSIS — F411 Generalized anxiety disorder: Secondary | ICD-10-CM | POA: Diagnosis not present

## 2022-04-03 DIAGNOSIS — R0602 Shortness of breath: Secondary | ICD-10-CM | POA: Diagnosis not present

## 2022-04-03 DIAGNOSIS — R079 Chest pain, unspecified: Secondary | ICD-10-CM | POA: Diagnosis not present

## 2022-04-03 MED ORDER — PROPRANOLOL HCL 20 MG PO TABS: 20.0000 mg | ORAL_TABLET | Freq: Three times a day (TID) | ORAL | 3 refills | Status: DC | PRN

## 2022-04-03 MED ORDER — NEBIVOLOL HCL 5 MG PO TABS: 5.0000 mg | ORAL_TABLET | ORAL | 6 refills | Status: DC | PRN

## 2022-04-03 NOTE — Patient Instructions (Addendum)
Medication Instructions:  Start bystolic 2.5 to 5 mg daily for atrial tachycardia Propranolol 10 to 20 mg TID PRN for breakthrough tachycardia  If you need a refill on your cardiac medications before your next appointment, please call your pharmacy.   Lab work: No new labs needed  Testing/Procedures: No new testing needed  Follow-Up: At [facility], you and your health needs are our priority.  As part of our continuing mission to provide you with exceptional heart care, we have created designated Provider Care Teams.  These Care Teams include your primary Cardiologist (physician) and Advanced Practice Providers (APPs -  Physician Assistants and Nurse Practitioners) who all work together to provide you with the care you need, when you need it.  You will need a follow up appointment as needed  Providers on your designated Care Team:   Nicolasa Ducking, NP Eula Listen, PA-C Cadence Fransico Michael, [place]  COVID-19 Vaccine Information can be found at: [email or web address] For questions related to vaccine distribution or appointments, please email vaccine@ [email or web address] or call [phone].

## 2022-04-05 IMAGING — CR DG CHEST 2V
2 series · 2 of 2 positions shown · non-contrast
Comparison: 06/11/2008

CLINICAL DATA: Chest pain for 2 weeks with progressive worsening

EXAM:
CHEST - 2 VIEW

[chest pa]
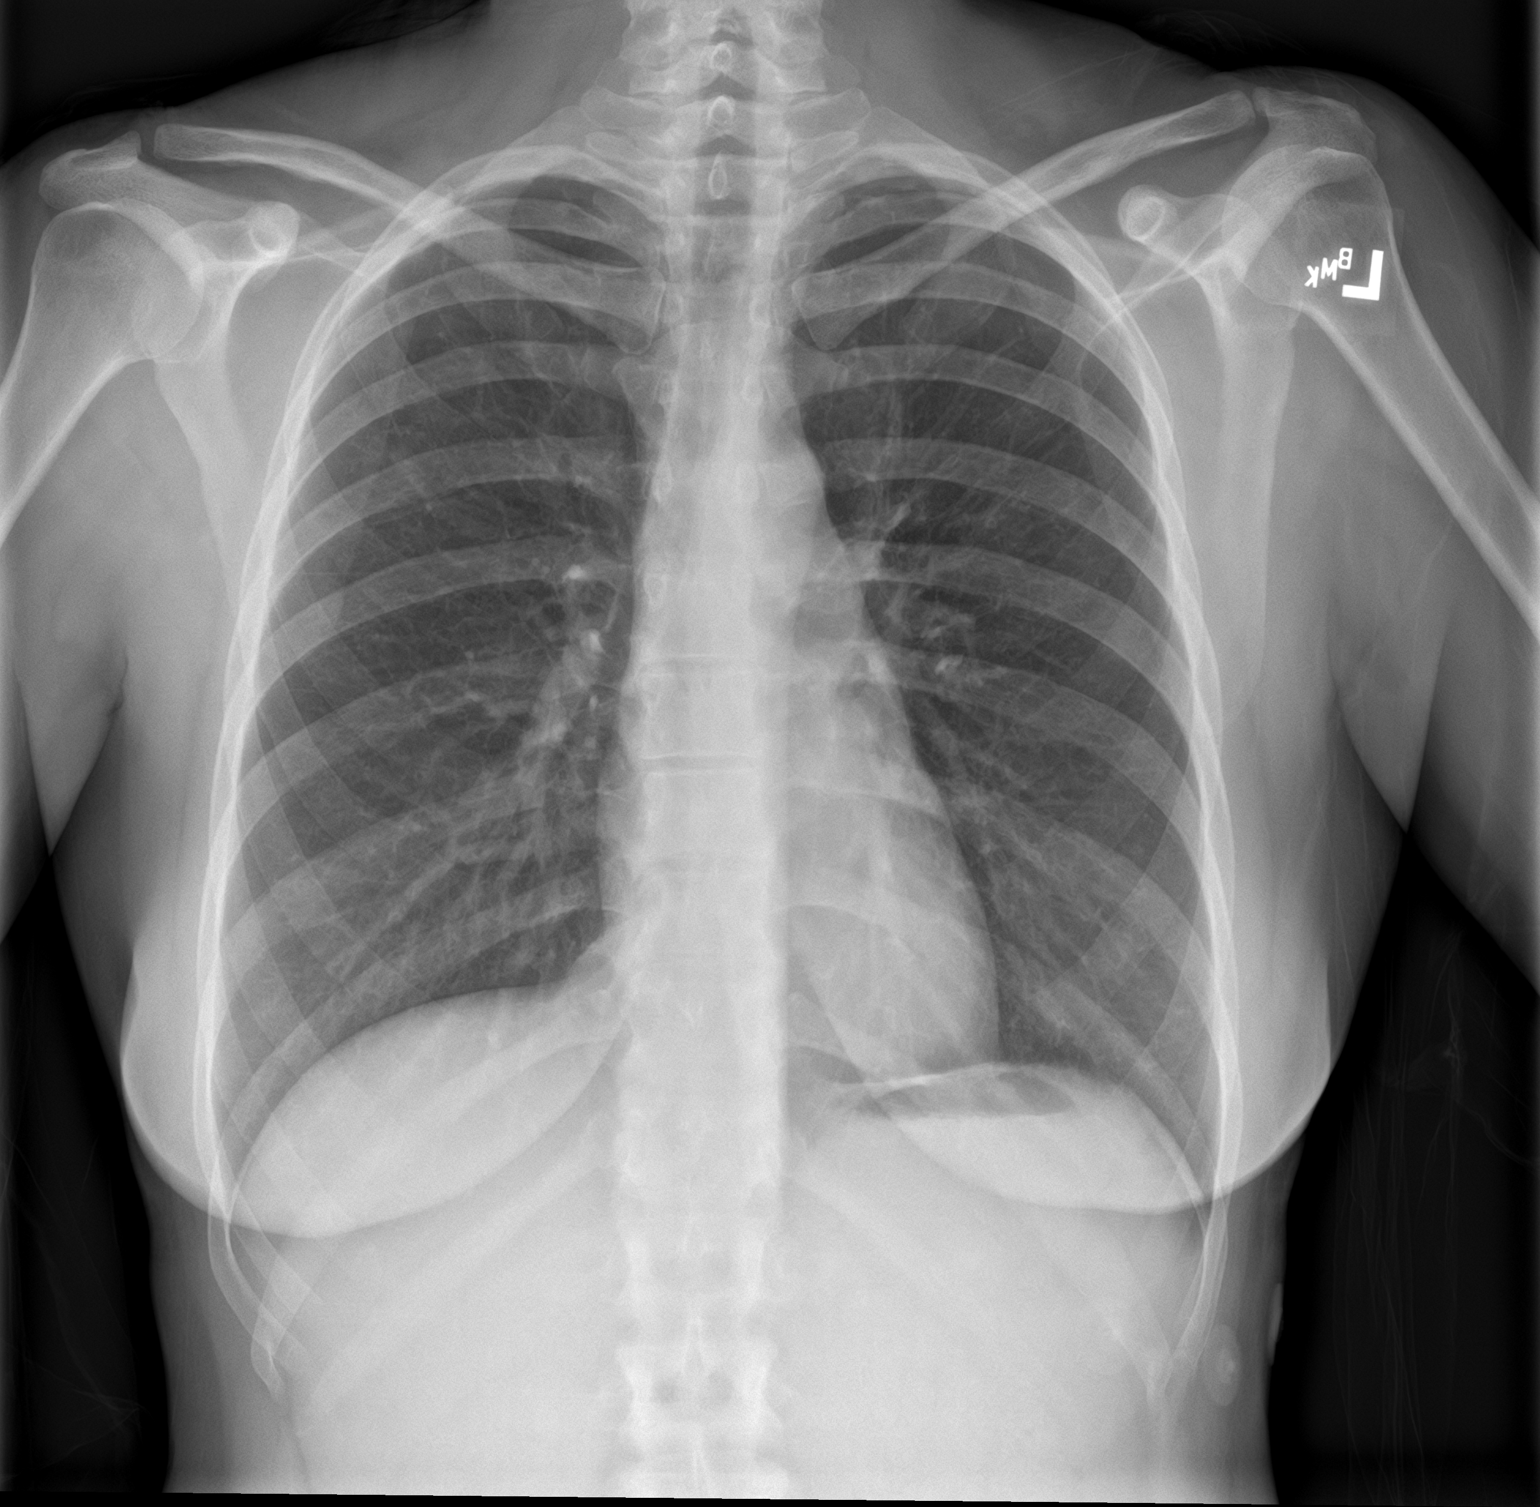

[chest lat]
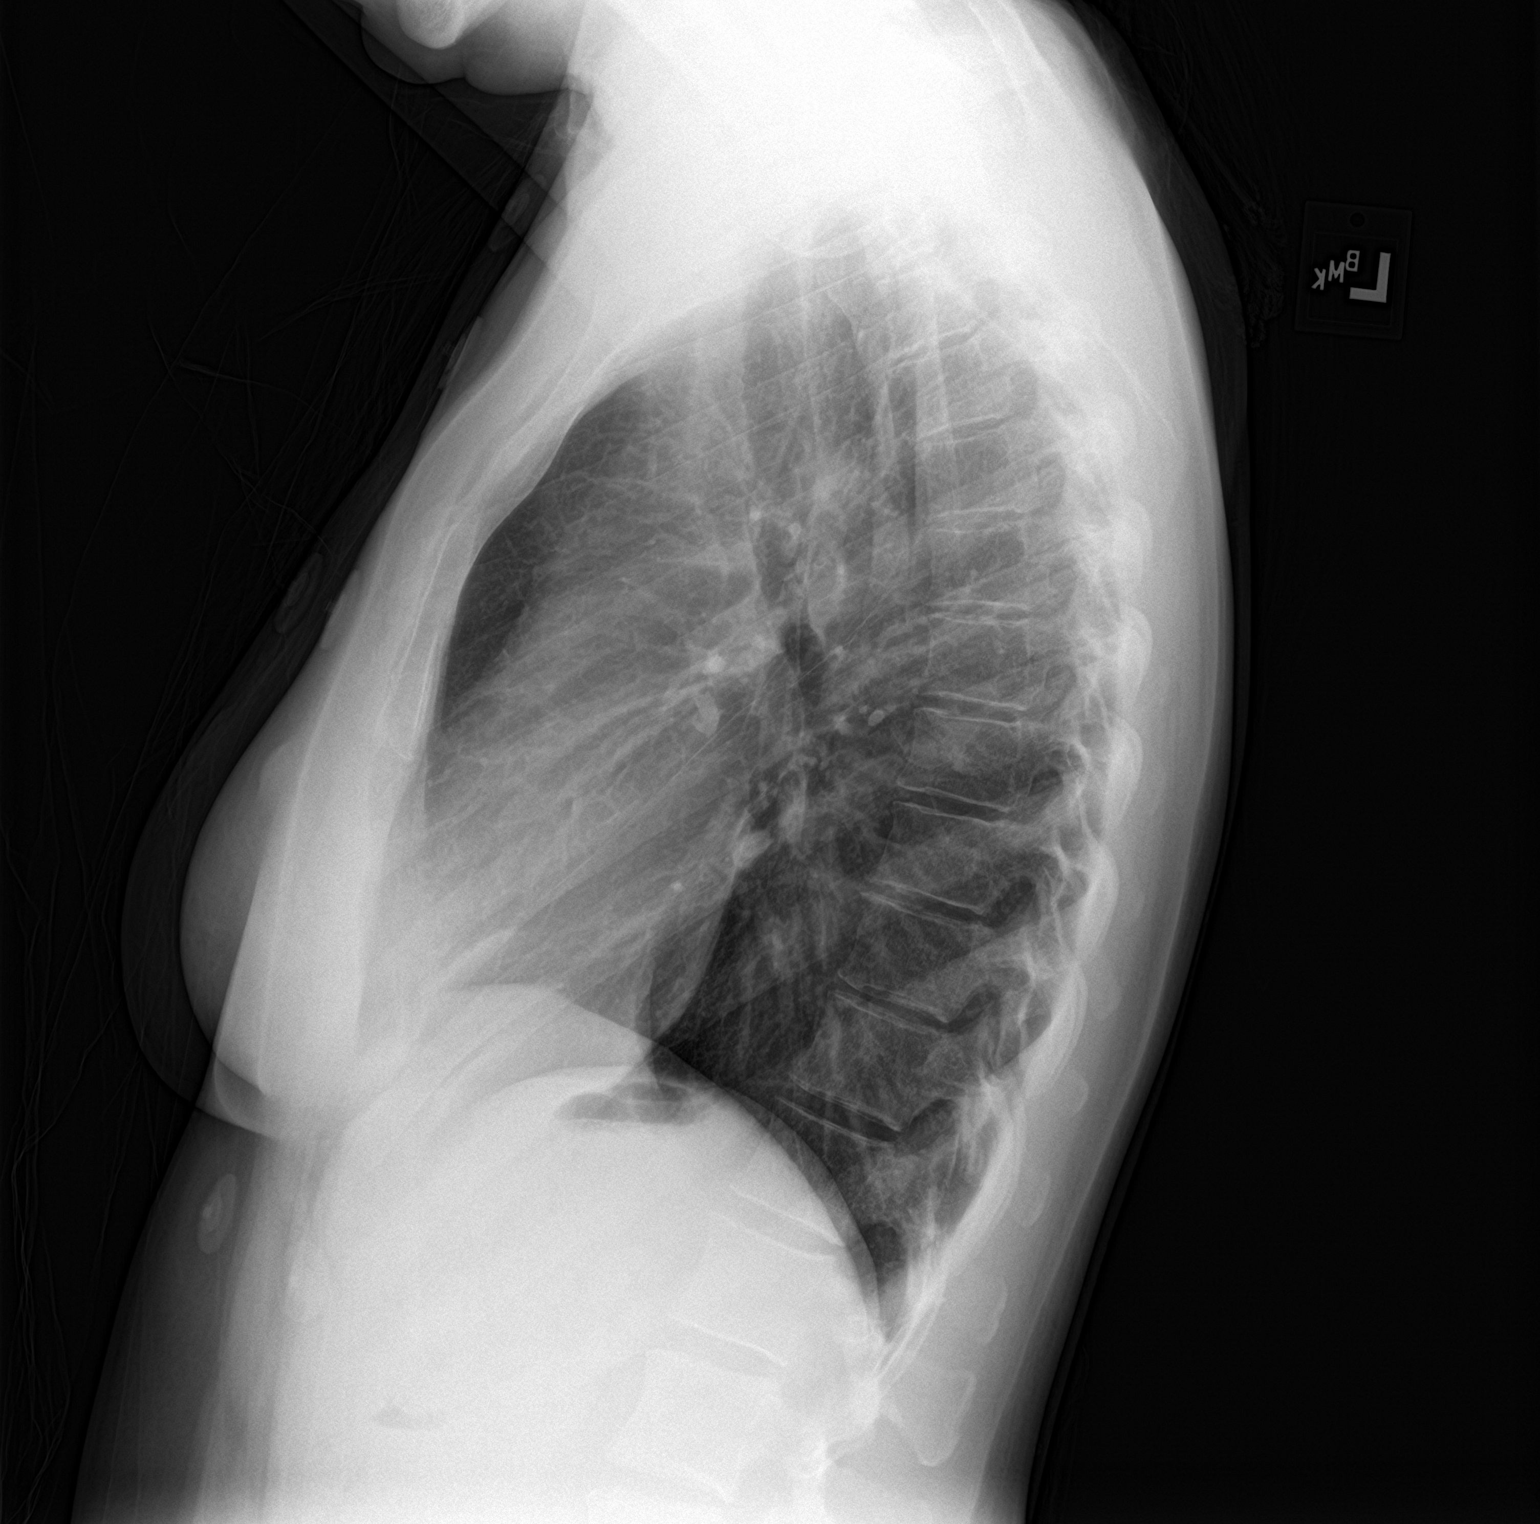

[2 of 2 positions shown; findings below may reference images not displayed]

FINDINGS: The heart size and mediastinal contours are within normal limits.
Both lungs are clear. The visualized skeletal structures are
unremarkable.
IMPRESSION: No active cardiopulmonary disease.

## 2022-05-08 ENCOUNTER — Encounter: Payer: Self-pay | Admitting: Cardiovascular Disease

## 2022-05-09 ENCOUNTER — Telehealth: Payer: Self-pay | Admitting: Cardiovascular Disease

## 2022-05-09 NOTE — Telephone Encounter (Signed)
Pt c/o BP issue: STAT if pt c/o blurred vision, one-sided weakness or slurred speech  1. What are your last 5 BP readings?   167/90  at post op appt tuesday 150/90 158/88  207/100 -Tuesday after appt  185/97 wed 5 pm 143/86 wednesday 2hr after prn meds (2.5 bystolic)   118/75 Thursday morn 121/78       2. Are you having any other symptoms (ex. Dizziness, headache, blurred vision, passed out)? Dizziness n/v fatigued  3. What is your BP issue? Recent surgery for cervical fusion was having issues with pain control but improving   Patient reports increase in bp after surgery   Patient wants advise on if ok to continue taking bystolic as it did help bring down bp

## 2022-05-09 NOTE — Telephone Encounter (Signed)
Call transferred directly to this RN.   Pt had post-op follow up appointment Tuesday this week s/p spinal fusion last month. At follow up pt's blood pressure was 167/90. Pt was told to call cardiology to follow up on elevated BP.   Pt rechecked BP at home Tuesday after her appt and BP was 207/100. Pt called PCP and was told to go to the ER and pt did not want to go. Pt states she was able to rest and get BP down to 160-170 systolic.   Yesterday (wed), BP 185/97 and pt took a Bystolic 2.5 mg and BP decr to 143/86. Pt states she was prescribed Bystolic 5 mg PRN for h/o atrial tachycardia for breakthrough tachycardia. Pt also with vomiting yesterday that has resolved today.   This morning BP 118/75 and pt rechecked prior to calling and BP 121/78. HR 69.  Pt denies blurry vision. Denies headache. BP has normalized. Pt feels well at this time.  Pt has also had significant pain post-op. Per pt she has pain management plan in place including Tramadol prescribed recently and just started taking.   Advised pt pain definitely can be contributing to high BP, and to continue to monitor BP at home. Advised may take Bystolic PRN as prescribed and take PRN pain medication per directed. Will forward message to Dr. Mariah Milling regarding parameters if pt to take PRN Bystolic for BP as well. Advised pt to let us know of any worsening s/s or continued elevated BP.  ER precautions provided. Pt voiced understanding.   Pt is scheduled to see her PCP tomorrow.  Follow up appt moved up sooner to see Cadence Fransico Michael, PA-C in our office 05/15/22.

## 2022-05-10 NOTE — Telephone Encounter (Signed)
Sent recommendations as reply to previous mychart message regarding same. Closing this encounter.

## 2022-05-14 ENCOUNTER — Telehealth: Payer: Self-pay | Admitting: Cardiovascular Disease

## 2022-05-14 NOTE — Telephone Encounter (Signed)
Called patient.   Advised her that Dr. Mariah Milling does not have any openings tomorrow.   Pt stated that she would keep appt with APP.

## 2022-05-14 NOTE — Telephone Encounter (Signed)
Pt c/o BP issue: STAT if pt c/o blurred vision, one-sided weakness or slurred speech  1. What are your last 5 BP readings? Within last 12 hours.  158/70 150/60 190/110 175/90  2. Are you having any other symptoms (ex. Dizziness, headache, blurred vision, passed out)?   No  3. What is your BP issue?   Patient stated she is feeling lethargic/ fatigued. She would like to see Dr. Mariah Milling instead of the PA tomorrow (7/19).

## 2022-05-15 ENCOUNTER — Encounter: Payer: Self-pay | Admitting: Medical

## 2022-05-15 ENCOUNTER — Ambulatory Visit (INDEPENDENT_AMBULATORY_CARE_PROVIDER_SITE_OTHER): Payer: No Typology Code available for payment source | Admitting: Medical

## 2022-05-15 VITALS — BP 130/72 | HR 62 | Ht 67.0 in | Wt 142.4 lb

## 2022-05-15 DIAGNOSIS — I471 Supraventricular tachycardia: Secondary | ICD-10-CM | POA: Diagnosis not present

## 2022-05-15 DIAGNOSIS — I10 Essential (primary) hypertension: Secondary | ICD-10-CM

## 2022-05-15 DIAGNOSIS — I479 Paroxysmal tachycardia, unspecified: Secondary | ICD-10-CM | POA: Diagnosis not present

## 2022-05-15 DIAGNOSIS — I4719 Other supraventricular tachycardia: Secondary | ICD-10-CM

## 2022-05-15 MED ORDER — CARVEDILOL 6.25 MG PO TABS: 6.2500 mg | ORAL_TABLET | Freq: Two times a day (BID) | ORAL | 3 refills | Status: DC

## 2022-05-15 NOTE — Progress Notes (Signed)
Cardiology Office Note:    Date:  05/15/2022   ID:  Martha [name], DOB 29-Aug-1971, MRN [id number]  PCP:  Mick Sell, MD  [facility] Cardiologist:  Julien Nordmann, MD  [facility] Electrophysiologist:  None   Referring MD: Mick Sell, MD   Chief Complaint: HTN  History of Present Illness:    Martha [name] is a 51 y.o. female with a hx of former smoker, atrial tachycardia, Rheumatoid arthritis, chronic pain, COVDI in September 2022 who presents for HTN and palpitations.   Previously seen by [facility] for syncope. PACs noted. Started on metoprolol. Echo was normal.    Seen by EP 2021 for PACs started on flecainide 50mg  BID. She went for ablation but it was no felt she had inducible SVT. She was started on diltiazem for persistent symptoms.   Echo September 2022 showed normal LVEF, normal RVSF, mild MR  ER visit May 2023 for chest pain/tachycardia. BP was up and bystolic was refilled.   Last seen 04/03/22 and was reporting persistent tachycardia after COVID. She did not tolerate flecainide or diltiazem. She was started on bystolic and given propranolol for breakthrough.  Today, the patient reports she had a recent neck surgery/fusion. She went back to work earlier than recommended. bP has been elevated, systolics 16-200s mainly in the afternoon. May be a reaction to the neck pain. She cut her hours back to part time. They recommended PCP evaluation. She has been taking bystolic since that time. She has been splitting the bystolic and taking it throughout the day. She is now taking tramadol for the neck pain, however she doesn't like how this makes her feel. No chest pain, SOB. When BP was hihg, she threw up and felt dizzy. No LLE, orthopnea, pnd. She has intermittent palpitations.   Past Medical History:  Diagnosis Date   ADHD (attention deficit hyperactivity disorder)    Anxiety    Atrial tachycardia (HCC)    Depression    Insomnia    Interstitial cystitis     sees Dr. 06/03/22    Migraine headache    Rheumatoid arthritis [facility])     Past Surgical History:  Procedure Laterality Date   BREAST SURGERY  1995, 2016   CARDIAC ELECTROPHYSIOLOGY STUDY AND ABLATION  09/2020   D&C  11/05/2018   Polyps removed - pathology okay   Explant capsulectomy  2019   implants removed   MEDIAL COLLATERAL LIGAMENT REPAIR, KNEE  2017    Current Medications: Current Meds  Medication Sig   acetaminophen (TYLENOL) 500 MG tablet Take by mouth.   busPIRone (BUSPAR) 7.5 MG tablet Take 7.5 mg by mouth 3 (three) times daily.   carvedilol (COREG) 6.25 MG tablet Take 1 tablet (6.25 mg total) by mouth 2 (two) times daily.   cetirizine (ZYRTEC) 5 MG tablet Take by mouth.   Coenzyme Q10 100 MG capsule Take by mouth.   LORazepam (ATIVAN) 1 MG tablet Take 1 mg by mouth 2 (two) times daily.   LORazepam (ATIVAN) 1 MG tablet Take by mouth.   MAGNESIUM PO Take 450 mg by mouth.   methocarbamol (ROBAXIN) 500 MG tablet Take by mouth 2 (two) times daily.   Multiple Vitamin (QUINTABS) TABS Take by mouth.   polyethylene glycol (MIRALAX) 17 g packet Take 17 g by mouth daily.   pregabalin (LYRICA) 50 MG capsule Take by mouth.   traZODone (DESYREL) 50 MG tablet Take 50 mg by mouth as needed.   TURMERIC PO Take 1,200 mg  by mouth 2 (two) times daily.    valACYclovir (VALTREX) 1000 MG tablet Take 500 mg by mouth as needed.   vitamin A 3 MG (10000 UNITS) capsule Take by mouth.   zolpidem (AMBIEN) 10 MG tablet Take 1 tablet by mouth at bedtime.   [DISCONTINUED] nebivolol (BYSTOLIC) 5 MG tablet Take 1 tablet (5 mg total) by mouth as needed.   [DISCONTINUED] propranolol (INDERAL) 20 MG tablet Take 1 tablet (20 mg total) by mouth 3 (three) times daily as needed.     Allergies:   Hydrocodone-acetaminophen, Other, Hydrocodone, and Oxycodone   Social History   Socioeconomic History   Marital status: Married    Spouse name: Not on file   Number of children: Not on file   Years of  education: Not on file   Highest education level: Not on file  Occupational History   Not on file  Tobacco Use   Smoking status: Never   Smokeless tobacco: Never  Vaping Use   Vaping Use: Never used  Substance and Sexual Activity   Alcohol use: Never   Drug use: No   Sexual activity: Yes  Other Topics Concern   Not on file  Social History Narrative   Not on file   Social Determinants of Health   Financial Resource Strain: Not on file  Food Insecurity: Not on file  Transportation Needs: Not on file  Physical Activity: Not on file  Stress: Not on file  Social Connections: Not on file     Family History: The patient's family history includes Hypertension in her father; Prostate cancer in her father. There is no history of Breast cancer.  ROS:   Please see the history of present illness.     All other systems reviewed and are negative.  EKGs/Labs/Other Studies Reviewed:    The following studies were reviewed today:   Echo 06/2021 INTERPRETATION  NORMAL LEFT VENTRICULAR SYSTOLIC FUNCTION  NORMAL RIGHT VENTRICULAR SYSTOLIC FUNCTION  MILD VALVULAR REGURGITATION (See above)  NO VALVULAR STENOSIS  ESTIMATED LVEF >55%  Aortic: NORMAL  Mitral: MILD MR  Tricuspid: MILD TR (2.33m/s)  Pulmonic: TRIVIAL PI   Heart monitor 05/2021 This result has an attachment that is not available.  Recording Start Date/Time: 05/21/2021 9:29:46 AM  Min HR: 45 BPM At: 05/22/2021 5:50:13 AM  Max HR: 95 BPM At: 05/22/2021 1:01:36 PM  Avg HR: 65 BPM  Pauses > :  0  Longest RR: 1.446 at 05/21/2021 11:32:47 PM   Ventricular Beats: 8  Couplets: 0  Runs: 0  Fastest Run:  BPM at  Longest Ventricular Run:  BPM at  Supraventricular Beats: 17  Supraventricular Fastest Run:  BPM at   Referring Physician: Glennis Brink [phone])  Processing Location: [facility]  Procedure Type: HOLTER SCAN & ANALYSIS  Date Processed: 05/28/2021  Recording Duration: 44 hr, 59 min  Hookup  Technician: Christ Kick  Analyst: Elberta Spaniel, CT  Recorder Number: [id number]   Diagnosis: Supraventricular tachycardia [I47.1]   Conclusions: 1)  This 48 hour Holter scan was adequate for interpretation. 2)  The patient was in sinus rhythm, sinus bradycardia, and sinus arrhythmia (strip 13). Average HR: 65 bpm.  3)  Ventricular ectopy consisted of 8 multifocal PVCs (strips 7,17).  4)  Supraventricular ectopy consisted of 17 PACs (strips 5,15). 5)  There were no pauses greater than 2.0 seconds noted. 6)  There were no symptoms noted in diary, however event button activations correlated with sinus bradycardia (strip 6), sinus rhythm   (  strips 8,9,10,11,12,14,16,18,19,21,22,23,24,25,26) and sinus rhythm with PACs (strip 20).   I have reviewed the ECG tracings and report, amended them as necessary and agree with the descriptions /interpretations enumerated on this summary page.  Reviewing Physician:  Signed By: Jamal Maes, M.D.  Date: 05/28/2021  EKG:  EKG is  ordered today.  The ekg ordered today demonstrates NSR 62bpm, nonspecific T wave changes  Recent Labs: 03/08/2022: ALT 22 03/27/2022: BUN 11; Creatinine, Ser 0.72; Hemoglobin 12.9; Platelets 201; Potassium 3.5; Sodium 139  Recent Lipid Panel    Component Value Date/Time   CHOL 163 11/05/2017 1540   TRIG 43.0 11/05/2017 1540   HDL 74.00 11/05/2017 1540   CHOLHDL 2 11/05/2017 1540   VLDL 8.6 11/05/2017 1540   LDLCALC 80 11/05/2017 1540     Physical Exam:    VS:  BP 130/72   Pulse 62   Ht 5\' 7"  (1.702 m)   Wt 142 lb 6.4 oz (64.6 kg)   BMI 22.30 kg/m     Wt Readings from Last 3 Encounters:  05/15/22 142 lb 6.4 oz (64.6 kg)  04/03/22 143 lb (64.9 kg)  03/27/22 140 lb (63.5 kg)     GEN:  Well nourished, well developed in no acute distress HEENT: Normal NECK: No JVD; No carotid bruits LYMPHATICS: No lymphadenopathy CARDIAC: RRR, no murmurs, rubs, gallops RESPIRATORY:  Clear to auscultation without rales,  wheezing or rhonchi  ABDOMEN: Soft, non-tender, non-distended MUSCULOSKELETAL:  No edema; No deformity  SKIN: Warm and dry NEUROLOGIC:  Alert and oriented x 3 PSYCHIATRIC:  Normal affect   ASSESSMENT:    1. Essential hypertension   2. Paroxysmal tachycardia (HCC)   3. Atrial tachycardia (HCC)    PLAN:    In order of problems listed above:  HTN She reports increase in blood pressure, especially after neck surgery. Systolics have been over 200, at which time she had a headache and vomiting. Suspect neck pain is playing a role in elevated pressures. She is intolerant to metoprolol. She is currently taking bystolic 5mg  daily and occasionally needing an extra half in the afternoon because that's when blood pressures seems to be the highest. Also with a h/o palpitations. I will start Coreg 6.25mg  BID. Will need to monitor heart rate with baseline in the 60s. We will see her back in 2 weeks, it may be once once neck pain improves, blood pressure will improve.   Paroxysmal Tachycardia Palpitations She has intermittent palpitations. Start Coreg as above.   Disposition: Follow up in 2 week(s) with MD/APP     Signed, Lariah Fleer 03/29/22, PA-C  05/15/2022 10:25 AM    [facility]

## 2022-05-15 NOTE — Patient Instructions (Signed)
Medication Instructions:  STOP Bystolic STOP Propranolol  START Carvedilol (Coreg) 6.25 mg 2 times a day  *If you need a refill on your cardiac medications before your next appointment, please call your pharmacy*  Lab Work: NONE ordered at this time of appointment   If you have labs (blood work) drawn today and your tests are completely normal, you will receive your results only by: MyChart Message (if you have MyChart) OR A paper copy in the mail If you have any lab test that is abnormal or we need to change your treatment, we will call you to review the results.  Testing/Procedures: NONE ordered at this time of appointment   Follow-Up: At [facility], you and your health needs are our priority.  As part of our continuing mission to provide you with exceptional heart care, we have created designated Provider Care Teams.  These Care Teams include your primary Cardiologist (physician) and Advanced Practice Providers (APPs -  Physician Assistants and Nurse Practitioners) who all work together to provide you with the care you need, when you need it.  We recommend signing up for the patient portal called "MyChart".  Sign up information is provided on this After Visit Summary.  MyChart is used to connect with patients for Virtual Visits (Telemedicine).  Patients are able to view lab/test results, encounter notes, upcoming appointments, etc.  Non-urgent messages can be sent to your provider as well.   To learn more about what you can do with MyChart, go to [email or web address].    Your next appointment:   2 week(s)  The format for your next appointment:   In Person  Provider:   You will see one of the following Advanced Practice Providers on your designated Care Team:   Nicolasa Ducking, NP Eula Listen, PA-C Cadence Fransico Michael, [place]      Other Instructions   Important Information About Sugar

## 2022-06-04 ENCOUNTER — Ambulatory Visit: Payer: No Typology Code available for payment source | Admitting: Medical

## 2022-07-23 ENCOUNTER — Other Ambulatory Visit: Payer: Self-pay | Admitting: Obstetrics and Gynecology

## 2022-07-23 DIAGNOSIS — Z1231 Encounter for screening mammogram for malignant neoplasm of breast: Secondary | ICD-10-CM

## 2022-07-23 LAB — RESULTS CONSOLE HPV: CHL HPV: NEGATIVE

## 2022-07-23 LAB — HM PAP SMEAR: HM Pap smear: NEGATIVE

## 2022-08-27 ENCOUNTER — Telehealth: Payer: Self-pay

## 2022-08-27 NOTE — Telephone Encounter (Signed)
See telephone contact note for 08/27/2022

## 2022-09-16 ENCOUNTER — Ambulatory Visit
Admission: RE | Admit: 2022-09-16 | Discharge: 2022-09-16 | Disposition: A | Discharge status: Home / Self Care | Payer: No Typology Code available for payment source | Source: Ambulatory Visit | Attending: Obstetrics and Gynecology | Admitting: Obstetrics and Gynecology

## 2022-09-16 DIAGNOSIS — Z1231 Encounter for screening mammogram for malignant neoplasm of breast: Secondary | ICD-10-CM | POA: Diagnosis present

## 2023-06-15 LAB — HEPATIC FUNCTION PANEL
ALT: 8 U/L (ref 7–35)
AST: 17 (ref 13–35)
Alkaline Phosphatase: 48 (ref 25–125)
Bilirubin, Total: 0.4

## 2023-06-15 LAB — BASIC METABOLIC PANEL
BUN: 10 (ref 4–21)
CO2: 25 — AB (ref 13–22)
Chloride: 110 — AB (ref 99–108)
Creatinine: 0.7 (ref 0.5–1.1)
Glucose: 100
Potassium: 4.1 mEq/L (ref 3.5–5.1)
Sodium: 139 (ref 137–147)

## 2023-06-15 LAB — CBC AND DIFFERENTIAL
HCT: 38 (ref 36–46)
Hemoglobin: 12.9 (ref 12.0–16.0)
Platelets: 203 10*3/uL (ref 150–400)
WBC: 6.7

## 2023-06-15 LAB — CBC: RBC: 4.1 (ref 3.87–5.11)

## 2023-06-15 LAB — COMPREHENSIVE METABOLIC PANEL
Albumin: 3.8 (ref 3.5–5.0)
Calcium: 9 (ref 8.7–10.7)
eGFR: >90

## 2023-06-19 ENCOUNTER — Other Ambulatory Visit: Payer: Self-pay | Admitting: Infectious Diseases

## 2023-06-19 ENCOUNTER — Ambulatory Visit: Payer: BC Managed Care – PPO | Attending: Medical | Admitting: Medical

## 2023-06-19 ENCOUNTER — Encounter: Payer: Self-pay | Admitting: Medical

## 2023-06-19 VITALS — BP 130/72 | HR 81 | Ht 67.0 in | Wt 133.0 lb

## 2023-06-19 DIAGNOSIS — I10 Essential (primary) hypertension: Secondary | ICD-10-CM | POA: Diagnosis not present

## 2023-06-19 DIAGNOSIS — R1013 Epigastric pain: Secondary | ICD-10-CM

## 2023-06-19 DIAGNOSIS — I479 Paroxysmal tachycardia, unspecified: Secondary | ICD-10-CM

## 2023-06-19 DIAGNOSIS — R079 Chest pain, unspecified: Secondary | ICD-10-CM

## 2023-06-19 MED ORDER — CARVEDILOL 12.5 MG PO TABS: 12.5000 mg | ORAL_TABLET | Freq: Two times a day (BID) | ORAL | 3 refills | Status: DC

## 2023-06-19 NOTE — Progress Notes (Signed)
Cardiology Office Note:    Date:  06/20/2023   ID:  Martha [name], DOB [birth year], MRN [id number]  PCP:  Mick Sell, MD  [facility] Cardiologist:  Julien Nordmann, MD  [facility] Electrophysiologist:  None   Referring MD: Mick Sell, MD   Chief Complaint: 1 month follow-up  History of Present Illness:    Martha [name] is a 52 y.o. female with a hx of former smoker, atrial tachycardia, Rheumatoid arthritis, chronic pain, COVID in September 2022 who presents for HTN.   Previously seen by [facility] for syncope. PACs noted. Started on metoprolol. Echo was normal.     Seen by EP 2021 for PACs started on flecainide 50mg  BID. She went for ablation but it was no felt she had inducible SVT. She was started on diltiazem for persistent symptoms.    Echo September 2022 showed normal LVEF, normal RVSF, mild MR   ER visit May 2023 for chest pain/tachycardia. BP was up and bystolic was refilled. She did not tolerate flecainide or diltiazem.   The patient was last seen 05/16/23 after neck surgery. She reported elevated blood pressures in the afternoons. The patient was started on coreg.  Patient went to the ER 06/15/23 for presyncope. She was hemodynamically stable. Work-up was unremarkable.  High-sensitivity troponin negative x 2.  Patient was even IV fluids and discharged home.  Today, the patient reports she is is being treated for RMSF. She has been on many different antibiotics and the PCP felt it was RMSF, and she was subsequently switched to doxy.  She reports pressures have been in the 180s at times. BP today is normal. She was given clonidine, but has not taken it. She says Coreg has otherwise been doing well prior to symptoms. She still feels dizzy and nauseated- this is worse with elevated BP. She reports chest heaviness at the ER (although ER note does not note chest pain).   Past Medical History:  Diagnosis Date   ADHD (attention deficit hyperactivity disorder)     Anxiety    Atrial tachycardia    Depression    Insomnia    Interstitial cystitis    sees Dr. Patsi Sears    Migraine headache    Rheumatoid arthritis [facility])     Past Surgical History:  Procedure Laterality Date   AUGMENTATION MAMMAPLASTY Bilateral    implants removed   BREAST SURGERY  1995, 2016   CARDIAC ELECTROPHYSIOLOGY STUDY AND ABLATION  09/2020   D&C  11/05/2018   Polyps removed - pathology okay   Explant capsulectomy  2019   implants removed   MEDIAL COLLATERAL LIGAMENT REPAIR, KNEE  2017    Current Medications: Current Meds  Medication Sig   acetaminophen (TYLENOL) 500 MG tablet Take by mouth.   buPROPion (WELLBUTRIN XL) 150 MG 24 hr tablet Take by mouth.   cetirizine (ZYRTEC) 5 MG tablet Take by mouth.   MAGNESIUM PO Take 450 mg by mouth.   pregabalin (LYRICA) 50 MG capsule Take by mouth.   traMADol (ULTRAM) 50 MG tablet Take 50 mg by mouth every 6 (six) hours as needed.   TURMERIC PO Take 1,200 mg by mouth 2 (two) times daily.    valACYclovir (VALTREX) 1000 MG tablet Take 500 mg by mouth as needed.   zolmitriptan (ZOMIG) 5 MG tablet Take 1 tablet (5 mg total) by mouth as needed for migraine.   zolpidem (AMBIEN) 10 MG tablet Take 1 tablet by mouth at bedtime.   [DISCONTINUED] carvedilol (COREG)  6.25 MG tablet Take 1 tablet (6.25 mg total) by mouth 2 (two) times daily.     Allergies:   Hydrocodone-acetaminophen, Other, Hydrocodone, and Oxycodone   Social History   Socioeconomic History   Marital status: Married    Spouse name: Not on file   Number of children: Not on file   Years of education: Not on file   Highest education level: Not on file  Occupational History   Not on file  Tobacco Use   Smoking status: Never   Smokeless tobacco: Never  Vaping Use   Vaping status: Never Used  Substance and Sexual Activity   Alcohol use: Never   Drug use: No   Sexual activity: Yes  Other Topics Concern   Not on file  Social History Narrative   Not on file    Social Determinants of Health   Financial Resource Strain: Low Risk  (04/18/2022)   Received from [facility], [facility], [facility]   Overall Financial Resource Strain (CARDIA)    Difficulty of Paying Living Expenses: Not hard at all  Food Insecurity: No Food Insecurity (03/18/2022)   Received from [facility], [facility]   Hunger Vital Sign    Worried About Running Out of Food in the Last Year: Never true    Ran Out of Food in the Last Year: Never true  Transportation Needs: No Transportation Needs (04/18/2022)   Received from [facility], [facility]   [facility] - Transportation    In the past 12 months, has lack of transportation kept you from medical appointments or from getting medications?: No    Lack of Transportation (Non-Medical): No  Physical Activity: Not on file  Stress: Not on file  Social Connections: Not on file     Family History: The patient's family history includes Hypertension in her father; Prostate cancer in her father. There is no history of Breast cancer.  ROS:   Please see the history of present illness.     All other systems reviewed and are negative.  EKGs/Labs/Other Studies Reviewed:    The following studies were reviewed today:  N/A  EKG:  EKG is ordered today.  The ekg ordered today demonstrates normal sinus rhythm, 81 bpm, nonspecific T wave changes  Recent Labs: No results found for requested labs within last 365 days.  Recent Lipid Panel    Component Value Date/Time   CHOL 163 11/05/2017 1540   TRIG 43.0 11/05/2017 1540   HDL 74.00 11/05/2017 1540   CHOLHDL 2 11/05/2017 1540   VLDL 8.6 11/05/2017 1540   LDLCALC 80 11/05/2017 1540     Physical Exam:    VS:  BP 130/72 (BP Location: Left Arm, Patient Position: Sitting, Cuff Size: Small)   Pulse 81   Ht 5\' 7"  (1.702 m)   Wt 133 lb (60.3 kg)   SpO2 95%   BMI 20.83 kg/m     Wt  Readings from Last 3 Encounters:  06/19/23 133 lb (60.3 kg)  05/15/22 142 lb 6.4 oz (64.6 kg)  04/03/22 143 lb (64.9 kg)     GEN:  Well nourished, well developed in no acute distress HEENT: Normal NECK: No JVD; No carotid bruits LYMPHATICS: No lymphadenopathy CARDIAC: RRR, no murmurs, rubs, gallops RESPIRATORY:  Clear to auscultation without rales, wheezing or rhonchi  ABDOMEN: Soft, non-tender, non-distended MUSCULOSKELETAL:  No edema; No deformity  SKIN: Warm and dry NEUROLOGIC:  Alert and oriented x 3  PSYCHIATRIC:  Normal affect   ASSESSMENT:    1. Essential hypertension   2. Chest pain of uncertain etiology   3. Paroxysmal tachycardia (HCC)    PLAN:    In order of problems listed above:  HTN Patient reports possible RMSF on doxy with multiple symptoms, some of which are improving. She still has nasuea and dizziness, which is exacerbated by high BP. She says BP has slowly been improving. I will increase Coreg to 12.5mg  BID.   Chest pain Patient reports chest pain when she went to the ER 06/15/23 and HS trop negative. She denies any further chest pain since then. We will wait until symptoms resolve and treatment of RMSF is finished prior to pursuing further ischemic work-up.   Paroxysmal tachycardia Palpitations She denies worsening palpitations, but has noted high heart rates. Increase Coreg as above.   Disposition: Follow up in 1 month(s) with Md/APP     Signed, Aneeka Bowden David Stall, PA-C  06/20/2023 10:00 AM    [facility]

## 2023-06-19 NOTE — Patient Instructions (Signed)
Medication Instructions:  Your physician recommends the following medication changes.  INCREASE: Coreg 12.5 mg twice daily  *If you need a refill on your cardiac medications before your next appointment, please call your pharmacy*   Lab Work: None If you have labs (blood work) drawn today and your tests are completely normal, you will receive your results only by: MyChart Message (if you have MyChart) OR A paper copy in the mail If you have any lab test that is abnormal or we need to change your treatment, we will call you to review the results.   Testing/Procedures: None  Follow-Up: At [facility], you and your health needs are our priority.  As part of our continuing mission to provide you with exceptional heart care, we have created designated Provider Care Teams.  These Care Teams include your primary Cardiologist (physician) and Advanced Practice Providers (APPs -  Physician Assistants and Nurse Practitioners) who all work together to provide you with the care you need, when you need it.  We recommend signing up for the patient portal called "MyChart".  Sign up information is provided on this After Visit Summary.  MyChart is used to connect with patients for Virtual Visits (Telemedicine).  Patients are able to view lab/test results, encounter notes, upcoming appointments, etc.  Non-urgent messages can be sent to your provider as well.   To learn more about what you can do with MyChart, go to [email or web address].    Your next appointment:   4 to 6 weeks  Provider:   You may see Julien Nordmann, MD or one of the following Advanced Practice Providers on your designated Care Team:   Nicolasa Ducking, NP Eula Listen, PA-C Cadence Fransico Michael, PA-C Charlsie Quest, NP

## 2023-06-20 ENCOUNTER — Emergency Department: Payer: BC Managed Care – PPO

## 2023-06-20 ENCOUNTER — Other Ambulatory Visit: Payer: Self-pay

## 2023-06-20 ENCOUNTER — Telehealth: Payer: Self-pay | Admitting: Cardiovascular Disease

## 2023-06-20 ENCOUNTER — Ambulatory Visit (HOSPITAL_BASED_OUTPATIENT_CLINIC_OR_DEPARTMENT_OTHER): Admission: RE | Admit: 2023-06-20 | Payer: BC Managed Care – PPO | Source: Ambulatory Visit

## 2023-06-20 ENCOUNTER — Emergency Department
Admission: EM | Admit: 2023-06-20 | Discharge: 2023-06-20 | Disposition: A | Discharge status: Home / Self Care | Payer: BC Managed Care – PPO | Attending: Emergency Medicine | Admitting: Emergency Medicine

## 2023-06-20 DIAGNOSIS — R1084 Generalized abdominal pain: Secondary | ICD-10-CM | POA: Insufficient documentation

## 2023-06-20 LAB — URINALYSIS, ROUTINE W REFLEX MICROSCOPIC
Bilirubin Urine: NEGATIVE
Glucose, UA: NEGATIVE mg/dL
Hgb urine dipstick: NEGATIVE
Ketones, ur: NEGATIVE mg/dL
Leukocytes,Ua: NEGATIVE
Nitrite: NEGATIVE
Protein, ur: NEGATIVE mg/dL
Specific Gravity, Urine: 1.006 (ref 1.005–1.030)
pH: 8 (ref 5.0–8.0)

## 2023-06-20 LAB — COMPREHENSIVE METABOLIC PANEL
ALT: 14 U/L (ref 0–44)
AST: 20 U/L (ref 15–41)
Albumin: 3.9 g/dL (ref 3.5–5.0)
Alkaline Phosphatase: 40 U/L (ref 38–126)
Anion gap: 8 (ref 5–15)
BUN: 10 mg/dL (ref 6–20)
CO2: 24 mmol/L (ref 22–32)
Calcium: 8.9 mg/dL (ref 8.9–10.3)
Chloride: 105 mmol/L (ref 98–111)
Creatinine, Ser: 0.62 mg/dL (ref 0.44–1.00)
GFR, Estimated: >60 mL/min (ref >60)
Glucose, Bld: 98 mg/dL (ref 70–99)
Potassium: 4.3 mmol/L (ref 3.5–5.1)
Sodium: 137 mmol/L (ref 135–145)
Total Bilirubin: 0.8 mg/dL (ref 0.3–1.2)
Total Protein: 7 g/dL (ref 6.5–8.1)

## 2023-06-20 LAB — LIPASE, BLOOD: Lipase: 35 U/L (ref 11–51)

## 2023-06-20 LAB — CBC
HCT: 37.1 % (ref 36.0–46.0)
Hemoglobin: 12.9 g/dL (ref 12.0–15.0)
MCH: 30.9 pg (ref 26.0–34.0)
MCHC: 34.8 g/dL (ref 30.0–36.0)
MCV: 88.8 fL (ref 80.0–100.0)
Platelets: 174 10*3/uL (ref 150–400)
RBC: 4.18 MIL/uL (ref 3.87–5.11)
RDW: 12.4 % (ref 11.5–15.5)
WBC: 7 10*3/uL (ref 4.0–10.5)
nRBC: 0 % (ref 0.0–0.2)

## 2023-06-20 LAB — POC URINE PREG, ED: Preg Test, Ur: NEGATIVE

## 2023-06-20 MED ORDER — HYDROCODONE-ACETAMINOPHEN 5-325 MG PO TABS
1.0000 | ORAL_TABLET | ORAL | 0 refills | Status: DC | PRN
Start: 2023-06-20 — End: 2023-06-24

## 2023-06-20 MED ORDER — ONDANSETRON 4 MG PO TBDP
4.0000 mg | ORAL_TABLET | Freq: Once | ORAL | Status: AC
  Administered 2023-06-20: 4 mg via ORAL
  Filled 2023-06-20: qty 1

## 2023-06-20 MED ORDER — ONDANSETRON 4 MG PO TBDP: 4.0000 mg | ORAL_TABLET | Freq: Three times a day (TID) | ORAL | 0 refills | Status: DC | PRN

## 2023-06-20 MED ORDER — TRAMADOL HCL 50 MG PO TABS
100.0000 mg | ORAL_TABLET | Freq: Once | ORAL | Status: AC
  Administered 2023-06-20: 100 mg via ORAL
  Filled 2023-06-20: qty 2

## 2023-06-20 NOTE — ED Provider Notes (Signed)
[facility] Provider Note    Event Date/Time   First MD Initiated Contact with Patient 06/20/23 1308     (approximate)  History   Chief Complaint: Abdominal Pain  HPI  Martha [name] is a 52 y.o. female with a past medical history of anxiety, depression, rheumatoid arthritis, presents to the emergency department for abdominal discomfort and vague symptoms.  According to the patient for the past month or so she has been experiencing intermittent symptoms of nausea, decreased appetite, abdominal pain, fatigue weakness.  Patient states she has been to the emergency department for the same she has been to her doctor was diagnosed with possible [facility] spotted fever completed a course of doxycycline, but then states her [facility] test came back negative.  Patient denies any fever.  States over the last several days she feels like the abdominal pain is worse.  Denies any vomiting or diarrhea but does state nausea at times.  Physical Exam   Triage Vital Signs: ED Triage Vitals [06/20/23 1144]  Encounter Vitals Group     BP (!) 143/72     Systolic BP Percentile      Diastolic BP Percentile      Pulse Rate 69     Resp 18     Temp 98.4 F (36.9 C)     Temp Source Oral     SpO2 100 %     Weight      Height      Head Circumference      Peak Flow      Pain Score 8     Pain Loc      Pain Education      Exclude from Growth Chart     Most recent vital signs: Vitals:   06/20/23 1144  BP: (!) 143/72  Pulse: 69  Resp: 18  Temp: 98.4 F (36.9 C)  SpO2: 100%    General: Awake, no distress.  CV:  Good peripheral perfusion.  Regular rate and rhythm  Resp:  Normal effort.  Equal breath sounds bilaterally.  Abd:  No distention.  Soft, not epigastric tenderness to palpation.  No rebound or guarding.  ED Results / Procedures / Treatments   EKG  EKG viewed and interpreted by myself shows normal sinus rhythm at 69 bpm with a narrow QRS, normal axis,  normal intervals, no concerning ST changes  RADIOLOGY  I reviewed and interpreted CT images.  No acute significant finding of my evaluation. Radiology is read a left ovarian enlargement.   MEDICATIONS ORDERED IN ED: Medications - No data to display   IMPRESSION / MDM / ASSESSMENT AND PLAN / ED COURSE  I reviewed the triage vital signs and the nursing notes.  Patient's presentation is most consistent with acute presentation with potential threat to life or bodily function.  Patient presents emergency department for a month or so of weakness nausea decreased appetite and intermittent abdominal pain.  States the pain is mostly in the epigastrium, lab work is reassuring occluding a normal CBC chemistry including LFTs and a normal lipase.  Urinalysis shows no concerning findings either.  Patient CT scan however does show enlargement of the left ovary and this would not really explain the patient's epigastric discomfort but she states at times it is more lower abdominal discomfort.  Given the patient's history of weakness nausea and in the large left ovary we will proceed with an ultrasound to rule out oncologic process.  Patient last menstrual period  was 1 month ago.  Ultrasound shows simple cyst of the left ovary.  Discussed with the patient discharged with short course of tramadol and Zofran patient to follow-up with her doctor.  Patient agreeable to plan of care.  FINAL CLINICAL IMPRESSION(S) / ED DIAGNOSES   Abdominal pain  Rx / DC Orders   Tramadol Zofran  Note:  This document was prepared using Dragon voice recognition software and may include unintentional dictation errors.   Minna Antis, MD 06/20/23 1622

## 2023-06-20 NOTE — ED Notes (Signed)
Patient states she takes Zofran at home.

## 2023-06-20 NOTE — ED Notes (Signed)
Patient has been taken to ultrasound.

## 2023-06-20 NOTE — ED Triage Notes (Addendum)
Pt c/o upper left abdominal pain and bloating x2 weeks that got worse this morning. Pt is AOX4, appears uncomfortable. Pt threw up after eating, states pain is worse after eating. Pt reports dizziness for the last few days. Pt was sent by PCP, states platelets were low. Pt took course of antibiotics for suspected rocky mt. Spotted fever, but test came back negative. Pt reports mild weakness, chills, denies fevers.

## 2023-06-20 NOTE — ED Notes (Signed)
Patient is calm, cooperative. C/o nausea.

## 2023-06-20 NOTE — ED Notes (Signed)
Patient remains at ultrasound. Husband is at bedside.

## 2023-06-20 NOTE — Telephone Encounter (Signed)
Spoke with patient to schedule 4-6 week follow up from 08/22 checkout  Patient wishes to call back when her calendar is available

## 2023-06-23 ENCOUNTER — Telehealth: Payer: Self-pay

## 2023-06-23 ENCOUNTER — Ambulatory Visit: Payer: BC Managed Care – PPO

## 2023-06-23 LAB — VITAMIN B12: Vitamin B-12: 647

## 2023-06-23 NOTE — Telephone Encounter (Signed)
Copied from CRM [id number]. Topic: Appointment Scheduling - Scheduling Inquiry for Clinic >> Jun 23, 2023  9:03 AM Marlow Baars wrote: Reason for CRM: The patient called in stating she has close friends Claris Che & Merceda Elks who referred her to Dr B because she has expertise with auto immune disease which she herself has. I explained the policy to the patient and she understands Dr B is not taking new patients currently but she is wondering if in any way the provider would make an exception so she can see her. Please assist patient further

## 2023-06-23 NOTE — Telephone Encounter (Signed)
Approved - friends and family - needs 40 min new patient appt

## 2023-06-23 NOTE — Telephone Encounter (Signed)
Appt made for 06/24/2023 at 10am.  Pt aware

## 2023-06-24 ENCOUNTER — Encounter: Payer: Self-pay | Admitting: Family Medicine

## 2023-06-24 ENCOUNTER — Ambulatory Visit: Payer: BC Managed Care – PPO | Admitting: Family Medicine

## 2023-06-24 VITALS — BP 132/72 | HR 70 | Temp 98.2°F | Resp 12 | Ht 67.0 in | Wt 137.5 lb

## 2023-06-24 DIAGNOSIS — I10 Essential (primary) hypertension: Secondary | ICD-10-CM

## 2023-06-24 DIAGNOSIS — I479 Paroxysmal tachycardia, unspecified: Secondary | ICD-10-CM

## 2023-06-24 DIAGNOSIS — F411 Generalized anxiety disorder: Secondary | ICD-10-CM | POA: Diagnosis not present

## 2023-06-24 DIAGNOSIS — N951 Menopausal and female climacteric states: Secondary | ICD-10-CM

## 2023-06-24 DIAGNOSIS — G43809 Other migraine, not intractable, without status migrainosus: Secondary | ICD-10-CM

## 2023-06-24 DIAGNOSIS — I4719 Other supraventricular tachycardia: Secondary | ICD-10-CM

## 2023-06-24 DIAGNOSIS — E538 Deficiency of other specified B group vitamins: Secondary | ICD-10-CM

## 2023-06-24 DIAGNOSIS — M0579 Rheumatoid arthritis with rheumatoid factor of multiple sites without organ or systems involvement: Secondary | ICD-10-CM

## 2023-06-24 DIAGNOSIS — F900 Attention-deficit hyperactivity disorder, predominantly inattentive type: Secondary | ICD-10-CM

## 2023-06-24 DIAGNOSIS — F5101 Primary insomnia: Secondary | ICD-10-CM

## 2023-06-24 DIAGNOSIS — G8929 Other chronic pain: Secondary | ICD-10-CM

## 2023-06-24 DIAGNOSIS — M545 Low back pain, unspecified: Secondary | ICD-10-CM

## 2023-06-24 NOTE — Assessment & Plan Note (Signed)
Increased since spine surgery in July 2023. Carvedilol dose recently increased due to illness. -Continue Carvedilol 12.5mg  twice daily.

## 2023-06-24 NOTE — Assessment & Plan Note (Signed)
Managed without medication through mindfulness and meditation. -Continue current management.

## 2023-06-24 NOTE — Assessment & Plan Note (Signed)
Chronic, managed with Ambien 10mg  at bedtime for 15 years. -Continue Ambien as prescribed.

## 2023-06-24 NOTE — Assessment & Plan Note (Signed)
On BB, f/b Cardiology No changes today

## 2023-06-24 NOTE — Assessment & Plan Note (Signed)
Chronic, managed with Wellbutrin XL 150mg  daily. Recent increase in anxiety due to RA flare and other health issues. -Consider adding an additional medication to better manage anxiety at follow-up visit in one month. - did well on lexapro previously but not on due to risk for arrhythmia per patient

## 2023-06-24 NOTE — Assessment & Plan Note (Signed)
Infrequent, managed with Zomig as needed. -Continue Zomig as needed.

## 2023-06-24 NOTE — Assessment & Plan Note (Signed)
Experiencing symptoms consistent with perimenopause. -Continue current management and monitor symptoms.

## 2023-06-24 NOTE — Assessment & Plan Note (Signed)
Recent flare with increased pain and anxiety. Currently on Humira every two weeks and Prednisone. -Continue Humira and Prednisone as prescribed. -Consider physical therapy for additional support. - followed by Dr Allena Katz ([facility])

## 2023-06-24 NOTE — Assessment & Plan Note (Signed)
F/b cardiology On BB - no changes

## 2023-06-24 NOTE — Progress Notes (Signed)
New Patient Office Visit  Subjective    Patient ID: Martha [name], female    DOB: 02/17/1971  Age: 52 y.o. MRN: [id number]  CC:  Chief Complaint  Patient presents with   New Patient (Initial Visit)    HPI Martha [name] presents to establish care Discussed the use of AI scribe software for clinical note transcription with the patient, who gave verbal consent to proceed.  History of Present Illness   The patient, with a complex medical history including rheumatoid arthritis (RA), interstitial cystitis, insomnia, hypertension, depression, anxiety, ADHD, atrial tachycardia, migraines, and a history of spine surgery, presents with a flare of RA symptoms, anxiety, and insomnia. The patient reports a recent episode of cellulitis following an insect bite, which was complicated by a ruptured eardrum and sinus infection. The patient is currently on multiple medications including Humira for RA, carvedilol for hypertension, Wellbutrin for anxiety, and Ambien for insomnia. The patient reports that the RA symptoms have been exacerbated following a recent switch to a generic form of Humira. The patient also reports increased anxiety and insomnia, which they attribute to the RA flare and recent illnesses. The patient has been managing the ADHD through mindfulness and self-compassion practices, and reports that the condition is under control without medication. The patient also reports that the interstitial cystitis is currently in remission.       Outpatient Encounter Medications as of 06/24/2023  Medication Sig   acetaminophen (TYLENOL) 500 MG tablet Take by mouth.   buPROPion (WELLBUTRIN XL) 150 MG 24 hr tablet Take 150 mg by mouth daily.   carvedilol (COREG) 12.5 MG tablet Take 1 tablet (12.5 mg total) by mouth 2 (two) times daily.   cetirizine (ZYRTEC) 5 MG tablet Take 10 mg by mouth 2 (two) times daily.   HUMIRA PEN 40 MG/0.4ML PNKT Every 2 weeks   MAGNESIUM PO Take 450 mg by mouth daily in  the afternoon.   ondansetron (ZOFRAN-ODT) 4 MG disintegrating tablet Take 1 tablet (4 mg total) by mouth every 8 (eight) hours as needed for nausea or vomiting.   predniSONE (DELTASONE) 5 MG tablet Take by mouth.   pregabalin (LYRICA) 50 MG capsule Take 50 mg by mouth 2 (two) times daily.   traMADol (ULTRAM) 50 MG tablet Take 50 mg by mouth every 6 (six) hours as needed.   TURMERIC PO Take 1,200 mg by mouth 2 (two) times daily.    valACYclovir (VALTREX) 1000 MG tablet Take 500 mg by mouth as needed.   zolmitriptan (ZOMIG) 5 MG tablet Take 1 tablet (5 mg total) by mouth as needed for migraine.   zolpidem (AMBIEN) 10 MG tablet Take 1 tablet by mouth at bedtime.   [DISCONTINUED] busPIRone (BUSPAR) 7.5 MG tablet Take 7.5 mg by mouth 3 (three) times daily. (Patient not taking: Reported on 06/19/2023)   [DISCONTINUED] Coenzyme Q10 100 MG capsule Take by mouth. (Patient not taking: Reported on 06/19/2023)   [DISCONTINUED] HYDROcodone-acetaminophen (NORCO/VICODIN) 5-325 MG tablet Take 1 tablet by mouth every 4 (four) hours as needed.   [DISCONTINUED] LORazepam (ATIVAN) 1 MG tablet Take 1 mg by mouth 2 (two) times daily. (Patient not taking: Reported on 06/19/2023)   [DISCONTINUED] methocarbamol (ROBAXIN) 500 MG tablet Take by mouth 2 (two) times daily. (Patient not taking: Reported on 06/19/2023)   [DISCONTINUED] polyethylene glycol (MIRALAX) 17 g packet Take 17 g by mouth daily. (Patient not taking: Reported on 06/19/2023)   No facility-administered encounter medications on file as of 06/24/2023.  Past Medical History:  Diagnosis Date   ADHD (attention deficit hyperactivity disorder)    Allergy    Anxiety    Atrial tachycardia    Depression    Hypertension    Insomnia    Interstitial cystitis    Migraine headache    Rheumatoid arthritis [facility])     Past Surgical History:  Procedure Laterality Date   AUGMENTATION MAMMAPLASTY Bilateral    implants removed   BREAST SURGERY  1995, 2016   CARDIAC  ELECTROPHYSIOLOGY STUDY AND ABLATION  09/2020   failed   COSMETIC SURGERY     Breast implants x 2. Remove breast implants removed   CYSTOSCOPY     with antibiotic wash x3   D&C  11/05/2018   Polyps removed - pathology okay   Explant capsulectomy  2019   implants removed   MEDIAL COLLATERAL LIGAMENT REPAIR, KNEE  2017   x3   SPINE SURGERY     Cervial corpectomy and fusion    Family History  Problem Relation Age of Onset   Anxiety disorder Mother    Hypertension Father    Prostate cancer Father    Heart disease Paternal Grandfather    Arthritis Paternal Grandmother    Breast cancer Neg Hx     Social History   Socioeconomic History   Marital status: Married    Spouse name: Not on file   Number of children: 2   Years of education: Not on file   Highest education level: Bachelor's degree (e.g., BA, AB, BS)  Occupational History   Not on file  Tobacco Use   Smoking status: Never   Smokeless tobacco: Never  Vaping Use   Vaping status: Never Used  Substance and Sexual Activity   Alcohol use: Yes    Comment: occasional 1x per month on avg   Drug use: No   Sexual activity: Yes    Partners: Male    Comment: Husband had vasectomy  Other Topics Concern   Not on file  Social History Narrative   Not on file   Social Determinants of Health   Financial Resource Strain: Low Risk  (06/23/2023)   Overall Financial Resource Strain (CARDIA)    Difficulty of Paying Living Expenses: Not hard at all  Food Insecurity: No Food Insecurity (06/23/2023)   Hunger Vital Sign    Worried About Running Out of Food in the Last Year: Never true    Ran Out of Food in the Last Year: Never true  Transportation Needs: No Transportation Needs (06/23/2023)   PRAPARE - Administrator, Civil Service (Medical): No    Lack of Transportation (Non-Medical): No  Physical Activity: Insufficiently Active (06/23/2023)   Exercise Vital Sign    Days of Exercise per Week: 3 days    Minutes of  Exercise per Session: 20 min  Stress: Stress Concern Present (06/23/2023)   [employer] of Occupational Health - Occupational Stress Questionnaire    Feeling of Stress : Very much  Social Connections: Socially Integrated (06/23/2023)   Social Connection and Isolation Panel [NHANES]    Frequency of Communication with Friends and Family: More than three times a week    Frequency of Social Gatherings with Friends and Family: Twice a week    Attends Religious Services: 1 to 4 times per year    Active Member of [employer] or Organizations: Yes    Attends Banker Meetings: 1 to 4 times per year    Marital Status: Married  Intimate Partner Violence: Not on file    ROS per HPI      Objective    BP 132/72 (BP Location: Left Arm, Patient Position: Sitting, Cuff Size: Normal)   Pulse 70   Temp 98.2 F (36.8 C) (Temporal)   Resp 12   Ht 5\' 7"  (1.702 m)   Wt 137 lb 8 oz (62.4 kg)   LMP 05/20/2023 (Approximate)   SpO2 100%   BMI 21.54 kg/m   Physical Exam Vitals reviewed.  Constitutional:      General: She is not in acute distress.    Appearance: She is well-developed.  HENT:     Head: Normocephalic and atraumatic.  Eyes:     General: No scleral icterus.    Conjunctiva/sclera: Conjunctivae normal.  Cardiovascular:     Rate and Rhythm: Normal rate and regular rhythm.     Heart sounds: Normal heart sounds.  Pulmonary:     Effort: Pulmonary effort is normal. No respiratory distress.     Breath sounds: Normal breath sounds.  Skin:    General: Skin is warm and dry.  Neurological:     Mental Status: She is alert and oriented to person, place, and time.  Psychiatric:        Behavior: Behavior normal.         Assessment & Plan:   Problem List Items Addressed This Visit       Cardiovascular and Mediastinum   Migraine headache    Infrequent, managed with Zomig as needed. -Continue Zomig as needed.      Atrial tachycardia    On BB, f/b Cardiology No  changes today      Paroxysmal tachycardia (HCC)    F/b cardiology On BB - no changes      Hypertension    Increased since spine surgery in July 2023. Carvedilol dose recently increased due to illness. -Continue Carvedilol 12.5mg  twice daily.        Musculoskeletal and Integument   Rheumatoid arthritis involving multiple sites with positive rheumatoid factor (HCC) - Primary    Recent flare with increased pain and anxiety. Currently on Humira every two weeks and Prednisone. -Continue Humira and Prednisone as prescribed. -Consider physical therapy for additional support. - followed by Dr Allena Katz ([facility])      Relevant Medications   predniSONE (DELTASONE) 5 MG tablet   Other Relevant Orders   Ambulatory referral to Physical Therapy     Other   GAD (generalized anxiety disorder)    Chronic, managed with Wellbutrin XL 150mg  daily. Recent increase in anxiety due to RA flare and other health issues. -Consider adding an additional medication to better manage anxiety at follow-up visit in one month. - did well on lexapro previously but not on due to risk for arrhythmia per patient      Attention deficit hyperactivity disorder (ADHD)    Managed without medication through mindfulness and meditation. -Continue current management.      Insomnia    Chronic, managed with Ambien 10mg  at bedtime for 15 years. -Continue Ambien as prescribed.      B12 deficiency   Perimenopause    Experiencing symptoms consistent with perimenopause. -Continue current management and monitor symptoms.      Other Visit Diagnoses     Chronic bilateral low back pain without sciatica       Relevant Medications   predniSONE (DELTASONE) 5 MG tablet   Other Relevant Orders   Ambulatory referral to Physical Therapy  Interstitial Cystitis Improved since the diagnosis of RA. No recent flares or surgeries. -Continue current management.  Spine Surgery Lumbar fusion with cage and artificial disc  in July 2023. -Continue current management.  Breast Implants Explanted due to concerns about potential health effects. -No further action needed at this time.  General Health Maintenance -Continue Zyrtec twice daily for sinus infection prevention. -Continue Tramadol 50mg  as needed for pain. -Continue Lyrica 50mg  twice daily for neuropathic pain. -Continue Valtrex as needed. -Continue Magnesium and Tumeric supplements. -Consider physical therapy for additional support in managing RA symptoms.       Total time spent on today's visit was greater than 60 minutes, including both face-to-face time and nonface-to-face time personally spent on review of chart (labs and imaging), discussing labs and goals, discussing further work-up, treatment options, reviewing outside records, answering patient's questions, and coordinating care.   Return in about 4 weeks (around 07/22/2023) for anxiety f/u.   Shirlee Latch, MD

## 2023-06-29 ENCOUNTER — Telehealth: Payer: Self-pay | Admitting: Student in an Organized Health Care Education/Training Program

## 2023-06-29 ENCOUNTER — Encounter: Payer: Self-pay | Admitting: Family Medicine

## 2023-06-29 ENCOUNTER — Emergency Department: Payer: BC Managed Care – PPO

## 2023-06-29 ENCOUNTER — Other Ambulatory Visit: Payer: Self-pay

## 2023-06-29 ENCOUNTER — Emergency Department
Admission: EM | Admit: 2023-06-29 | Discharge: 2023-06-30 | Disposition: A | Discharge status: Home / Self Care | Payer: BC Managed Care – PPO | Attending: Emergency Medicine | Admitting: Emergency Medicine

## 2023-06-29 DIAGNOSIS — M069 Rheumatoid arthritis, unspecified: Secondary | ICD-10-CM | POA: Insufficient documentation

## 2023-06-29 DIAGNOSIS — I10 Essential (primary) hypertension: Secondary | ICD-10-CM | POA: Insufficient documentation

## 2023-06-29 DIAGNOSIS — R079 Chest pain, unspecified: Secondary | ICD-10-CM | POA: Insufficient documentation

## 2023-06-29 DIAGNOSIS — F411 Generalized anxiety disorder: Secondary | ICD-10-CM

## 2023-06-29 LAB — CBC
HCT: 38.6 % (ref 36.0–46.0)
Hemoglobin: 13.4 g/dL (ref 12.0–15.0)
MCH: 30.7 pg (ref 26.0–34.0)
MCHC: 34.7 g/dL (ref 30.0–36.0)
MCV: 88.5 fL (ref 80.0–100.0)
Platelets: 218 10*3/uL (ref 150–400)
RBC: 4.36 MIL/uL (ref 3.87–5.11)
RDW: 12.2 % (ref 11.5–15.5)
WBC: 10.6 10*3/uL — ABNORMAL HIGH (ref 4.0–10.5)
nRBC: 0 % (ref 0.0–0.2)

## 2023-06-29 LAB — COMPREHENSIVE METABOLIC PANEL
ALT: 15 U/L (ref 0–44)
AST: 19 U/L (ref 15–41)
Albumin: 3.9 g/dL (ref 3.5–5.0)
Alkaline Phosphatase: 39 U/L (ref 38–126)
Anion gap: 8 (ref 5–15)
BUN: 12 mg/dL (ref 6–20)
CO2: 24 mmol/L (ref 22–32)
Calcium: 9.2 mg/dL (ref 8.9–10.3)
Chloride: 105 mmol/L (ref 98–111)
Creatinine, Ser: 0.75 mg/dL (ref 0.44–1.00)
GFR, Estimated: >60 mL/min (ref >60)
Glucose, Bld: 107 mg/dL — ABNORMAL HIGH (ref 70–99)
Potassium: 4.2 mmol/L (ref 3.5–5.1)
Sodium: 137 mmol/L (ref 135–145)
Total Bilirubin: 0.6 mg/dL (ref 0.3–1.2)
Total Protein: 7.6 g/dL (ref 6.5–8.1)

## 2023-06-29 LAB — TROPONIN I (HIGH SENSITIVITY): Troponin I (High Sensitivity): 4 ng/L (ref <18)

## 2023-06-29 MED ORDER — LORAZEPAM 2 MG/ML IJ SOLN
1.0000 mg | Freq: Once | INTRAMUSCULAR | Status: AC
  Administered 2023-06-29: 1 mg via INTRAVENOUS
  Filled 2023-06-29: qty 1

## 2023-06-29 MED ORDER — DIPHENHYDRAMINE HCL 25 MG PO CAPS
25.0000 mg | ORAL_CAPSULE | Freq: Once | ORAL | Status: AC
  Administered 2023-06-29: 25 mg via ORAL
  Filled 2023-06-29: qty 1

## 2023-06-29 MED ORDER — HYDROMORPHONE HCL 1 MG/ML IJ SOLN
1.0000 mg | Freq: Once | INTRAMUSCULAR | Status: AC
  Administered 2023-06-29: 1 mg via INTRAVENOUS
  Filled 2023-06-29: qty 1

## 2023-06-29 MED ORDER — CLONIDINE HCL 0.1 MG PO TABS
0.2000 mg | ORAL_TABLET | Freq: Once | ORAL | Status: AC
  Administered 2023-06-29: 0.2 mg via ORAL
  Filled 2023-06-29: qty 2

## 2023-06-29 MED ORDER — HYDROCODONE-ACETAMINOPHEN 5-325 MG PO TABS: 1.0000 | ORAL_TABLET | ORAL | 0 refills | Status: AC | PRN

## 2023-06-29 NOTE — Discharge Instructions (Signed)
Continue taking your blood pressure medication as prescribed.  Follow-up with your primary care provider and rheumatologist.  You may take the hydrocodone as needed for pain over the next several days.

## 2023-06-29 NOTE — Telephone Encounter (Signed)
Telephone Encounter  06/29/2023 19:01  Patient called cardiology pager overnight to discuss elevated blood pressures.She was recently seen by Cadence Furth PA on 06/19/23 for elevated blood pressures. She states that she is currently having a RA flare and she thinks some of the elevations are associated with pain. Her highest BP in the last week was 204/110. Currently her SBP in 185 mmHg. Her average BP is 140 mmHg, but this last week has had systolic elevations in the 180s-200 mmHg.   Discussed with Ms. Cragle that it is difficult to manage these elevations over the phone, but I would not recommend ad hoc dosing of carvedilol especially given a HR of 68 bpm. I recommend the patient go to urgent care or ED if her BP continues to rise above 190/100 for more urgent blood pressure management. If she comes to the [facility] ED, I would gladly see her in the ED tonight for blood pressure management and obtain labs. I will notify her provider of our conversation today to assist with follow up.    Rosita Kea, MD MS  Cardiology [facility]

## 2023-06-29 NOTE — ED Triage Notes (Signed)
Pt reports chest pain and HTN that onset ~30 min pta. Hx of same. Reports bp of 190/110 at home. Reports pain is substernal and does not radiate. Pt denies h/a, dizziness, n/v, vision change. Pt alert and oriented. Reports feeling tired and weak. Pt breathing unlabored and speaking in full sentences.

## 2023-06-29 NOTE — ED Provider Notes (Signed)
[facility] Provider Note    Event Date/Time   First MD Initiated Contact with Patient 06/29/23 2153     (approximate)   History   Chest Pain   HPI  Martha ARBELAEZ is a 52 y.o. female with a history of hypertension, atrial tachycardia, rheumatoid arthritis, ADHD, and depression who presents with generalized pain which she ascribes to a likely rheumatoid arthritis flare, along with elevated blood pressure.  The patient states that she has had increased pain all over her body over the last several days distant with prior RA flares.  She has taken tramadol a few times, but she is afraid of potential physical dependency (the patient states that previously when she had been on it for a while she felt very bad when she stopped).  Over the last few nights she has been feeling unwell and noted her blood pressure was significantly elevated.  She is on carvedilol for the blood pressure and heart rate.  She has had a few episodes of feeling lightheaded and sweaty associated with the elevated blood pressure.  She also had some chest pain earlier this evening.  She denies any nausea or vomiting.  She has no difficulty breathing.  Denies any palpitations.  She has no extremity swelling.  I reviewed the past medical records.  The patient's most recent outpatient encounter was on 8/27 with primary care for follow-up of her chronic conditions.  The patient is on carvedilol due to hypertension and paroxysmal tachycardia.   Physical Exam   Triage Vital Signs: ED Triage Vitals  Encounter Vitals Group     BP 06/29/23 2152 (!) 210/110     Systolic BP Percentile --      Diastolic BP Percentile --      Pulse Rate 06/29/23 2152 79     Resp 06/29/23 2152 18     Temp 06/29/23 2152 (!) 97.5 F (36.4 C)     Temp Source 06/29/23 2152 Oral     SpO2 06/29/23 2152 100 %     Weight 06/29/23 2150 133 lb (60.3 kg)     Height 06/29/23 2150 5\' 7"  (1.702 m)     Head Circumference --       Peak Flow --      Pain Score 06/29/23 2150 10     Pain Loc --      Pain Education --      Exclude from Growth Chart --     Most recent vital signs: Vitals:   06/29/23 2152 06/29/23 2230  BP: (!) 210/110 (!) 170/88  Pulse: 79 67  Resp: 18 14  Temp: (!) 97.5 F (36.4 C)   SpO2: 100% 100%     General: Awake, relatively well-appearing, no distress.  CV:  Good peripheral perfusion.  Normal heart sounds. Resp:  Normal effort. Lungs CTAB. Abd:  No distention.  Other:  No peripheral edema.   ED Results / Procedures / Treatments   Labs (all labs ordered are listed, but only abnormal results are displayed) Labs Reviewed  CBC - Abnormal; Notable for the following components:      Result Value   WBC 10.6 (*)    All other components within normal limits  COMPREHENSIVE METABOLIC PANEL - Abnormal; Notable for the following components:   Glucose, Bld 107 (*)    All other components within normal limits  POC URINE PREG, ED  TROPONIN I (HIGH SENSITIVITY)     EKG  ED ECG REPORT I, Dionne Bucy,  the attending physician, personally viewed and interpreted this ECG.  Date: 06/29/2023 EKG Time: 2153 Rate: 71 Rhythm: normal sinus rhythm QRS Axis: normal Intervals: normal ST/T Wave abnormalities: Nonspecific ST abnormalities Narrative Interpretation: no evidence of acute ischemia; no significant change when compared to EKG of 06/20/2023    RADIOLOGY  Chest x-ray: I independently viewed and interpreted the images; there is no focal consolidation or edema   PROCEDURES:  Critical Care performed: No  Procedures   MEDICATIONS ORDERED IN ED: Medications  LORazepam (ATIVAN) injection 1 mg (has no administration in time range)  cloNIDine (CATAPRES) tablet 0.2 mg (0.2 mg Oral Given 06/29/23 2236)  HYDROmorphone (DILAUDID) injection 1 mg (1 mg Intravenous Given 06/29/23 2236)  diphenhydrAMINE (BENADRYL) capsule 25 mg (25 mg Oral Given 06/29/23 2235)     IMPRESSION / MDM /  ASSESSMENT AND PLAN / ED COURSE  I reviewed the triage vital signs and the nursing notes.  52 year old female with PMH as noted above presents with worsening generalized pain over the last several days consistent with prior RA flares along with elevated blood pressure.  The patient had some chest pain today as well as diaphoresis.  On exam the patient is overall well-appearing.  She is hypertensive with otherwise normal vital signs.  Physical exam is otherwise unremarkable for acute findings.  EKG is nonischemic.  Differential diagnosis includes, but is not limited to, RA flare, dehydration, electrolyte abnormality, viral syndrome, essential hypertension, hypertensive urgency.  I suspect that the patient's acute pain over the last several days is causing an increase in her blood pressure which she is resulting in the other symptoms.  We will give clonidine for blood pressure, IV Dilaudid for pain, and obtain basic labs and cardiac enzymes to rule out end organ dysfunction.  Patient's presentation is most consistent with acute presentation with potential threat to life or bodily function.  The patient is on the cardiac monitor to evaluate for evidence of arrhythmia and/or significant heart rate changes.   ----------------------------------------- 11:21 PM on 06/29/2023 -----------------------------------------  Blood pressure is improving.  Lab workup is unremarkable.  Troponin is negative.  Given the duration of the symptoms there is no indication for a repeat.  CMP shows normal creatinine.  The patient now endorses some anxiety.  I have ordered a dose of Ativan.  We will continue to monitor the blood pressure, however I anticipate discharge home once it is improved.  I have prescribed a small quantity of pain medication to cover the patient for the next few days until she can follow-up.  I have signed her out to the oncoming ED physician.  FINAL CLINICAL IMPRESSION(S) / ED DIAGNOSES   Final  diagnoses:  Hypertension, unspecified type  Rheumatoid arthritis flare (HCC)     Rx / DC Orders   ED Discharge Orders          Ordered    HYDROcodone-acetaminophen (NORCO/VICODIN) 5-325 MG tablet  Every 4 hours PRN        06/29/23 2320             Note:  This document was prepared using Dragon voice recognition software and may include unintentional dictation errors.    Dionne Bucy, MD 06/29/23 2322

## 2023-06-30 ENCOUNTER — Encounter: Payer: Self-pay | Admitting: Cardiovascular Disease

## 2023-06-30 MED ORDER — CLONIDINE HCL 0.1 MG PO TABS: 0.1000 mg | ORAL_TABLET | Freq: Two times a day (BID) | ORAL | 0 refills | Status: DC | PRN

## 2023-06-30 NOTE — ED Provider Notes (Signed)
Vitals:   06/29/23 2330 06/29/23 2345  BP: (!) 174/93   Pulse:  63  Resp:  14  Temp:    SpO2:  100%    Resting comfortably.  Systolic blood pressure currently 145.  She is awake alert no distress.  She would like to have an as needed blood pressure medication, I think would be reasonable to start her on a very brief course of clonidine as I suspect her pain is somewhat driving the elevated blood pressure in conjunction with her flare of her rheumatoid.  She is resting comfortably fully awake and alert.  Plans to call Dr. Mariah Milling on Tuesday for additional follow-up.  Return precautions and treatment recommendations and follow-up discussed with the patient who is agreeable with the plan.    Sharyn Creamer, MD 06/30/23 [phone]

## 2023-07-01 ENCOUNTER — Telehealth: Payer: Self-pay

## 2023-07-01 ENCOUNTER — Encounter: Payer: Self-pay | Admitting: Cardiovascular Disease

## 2023-07-01 ENCOUNTER — Ambulatory Visit: Payer: BC Managed Care – PPO | Attending: Cardiovascular Disease | Admitting: Cardiovascular Disease

## 2023-07-01 VITALS — BP 160/90 | HR 69 | Ht 67.5 in | Wt 135.1 lb

## 2023-07-01 DIAGNOSIS — I10 Essential (primary) hypertension: Secondary | ICD-10-CM | POA: Diagnosis not present

## 2023-07-01 DIAGNOSIS — I4719 Other supraventricular tachycardia: Secondary | ICD-10-CM | POA: Diagnosis not present

## 2023-07-01 DIAGNOSIS — I479 Paroxysmal tachycardia, unspecified: Secondary | ICD-10-CM | POA: Diagnosis not present

## 2023-07-01 NOTE — Telephone Encounter (Signed)
Received a message from Cadence Furth PA-C requesting to schedule pt an appointment due to elevated BP per on call phone conversation on 9/1.  Appointment scheduled for today 07/01/23 at 4 pm Pt agreeable to plan

## 2023-07-01 NOTE — Patient Instructions (Signed)

## 2023-07-01 NOTE — Progress Notes (Addendum)
Cardiology Office Note  Date:  07/01/2023   ID:  Martha [name], DOB Mar 16, 1971, MRN [id number]  PCP:  Martha Downer, MD   Chief Complaint  Patient presents with   Hypertension    HPI:  [name] is a 52 year old woman with past medical history of Former smoker Atrial tachycardia Rheumatoid arthritis Chronic pain COVID 16 July 2021, treated with PACs elevated Who presents for follow-up of her chronic chest pain, paroxysmal tachycardia  Last office visit October 2022 In follow-up today reports having a difficult month or more Diagnosed with possible [place], treated with doxy Stopped humera during that time for several weeks Feels off the Humira she developed a RA flare Pain was bad Seen by rheum, restarted humera, prednisone taper Pain no better, Tried tramadol 50 mg as needed, pain not much better ABD pain, Bad bloating under ribs  Visit to the ER September 1  for high blood pressure, body pain Treated with Demerol, pain much better Received prescription for different pain medication which she has not tried yet, [facility] it [facility]  Better blood pressure yesterday, numbers low, pain was better No pain meds today  She is continued on Coreg 12.5 BID  Scheduled to see neurosurgery and rheumatology in follow-up  EKG personally reviewed by myself on todays visit EKG Interpretation Date/Time:  Tuesday July 01 2023 16:22:58 EDT Ventricular Rate:  69 PR Interval:  146 QRS Duration:  86 QT Interval:  400 QTC Calculation: 428 R Axis:   48  Text Interpretation: Normal sinus rhythm normal ekg When compared with ECG of 29-Jun-2023 21:53, No significant change was found Confirmed by Julien Nordmann [phone]) on 07/05/2023 6:24:44 PM    Other past medical history reviewed October 2021 in [place] for her daughter's wedding, some chest fullness and palpitations, near syncope In and out of the emergency room [facility], [facility], PACs noted Followed by  [facility] cardiology, initial visit November 2021 Driving to work had dizziness, chest tightness, near syncope, reported had been going on and off for 1 year or more Seen in the emergency room for palpitations, near syncope at that time Started on metoprolol 12.5 twice daily, did not get relief Notes indicating lots of stress, grandmother's death, daughter's wedding Essentially normal echocardiogram August 29, 2020, ordered for palpitations, PVCs Cardiac CTA essentially normal Monitor done at [facility], results not available  Seen by EP September 26, 2020, PACs,  Started on flecainide 50 twice daily Repeat monitor showing patient triggered events not associated with significant arrhythmia Felt to have atrial tachycardia in follow-up September 21, 2020 at [facility] Although monitor appeared tachycardia had been suppressed Repeat 48-hour monitor ordered as she reported she had not exerted herself to induce symptoms, no significant arrhythmia  Went for ablation of her atrial tachycardia October 05, 2020 Had no inducible SVT, felt to have atrial tachycardia based on prior Holter No other inducible arrhythmia on isoproterenol  Continued  tachycardia, palpitations, was started on diltiazem with some improvement of her symptoms Dose up to 180 daily because of fatigue, back to 120 daily  Work-up with pulmonary for shortness of breath symptoms including CT scans, PFTs were unremarkable  Cardiology office visit February 16, 2021 Numerous episodes of SVT 4-5 beats at a time, Sinus tachycardia noted Repeat 48-hour monitor was ordered Does not appear that she has had cardiology follow-up at [facility] since that time  Initial RA diagnosis made >2 years ago, presenting symptoms dry eyes,   CT chest March 2022 No PE, images  pulled up and reviewed no significant coronary calcification, no aortic atherosclerosis  Symptoms from COVID on meeting with primary care July 11, 2021 reported cough, chest tightness,  burning Treated with prednisone, inhaler, azithromycin Reports having heart rate 160s post covid, also chest pain  Continues to have tachycardia on exertion perhaps worse after COVID Back on flecainide 1 1/2 wks ago, stopped diltiazem Sx are doubled, chest pain, SOB, feels medications made it worse  PMH:   has a past medical history of ADHD (attention deficit hyperactivity disorder), Allergy, Anxiety, Atrial tachycardia, Depression, Hypertension, Insomnia, Interstitial cystitis, Migraine headache, and Rheumatoid arthritis (HCC).  PSH:    Past Surgical History:  Procedure Laterality Date   AUGMENTATION MAMMAPLASTY Bilateral    implants removed   BREAST SURGERY  1995, 2016   CARDIAC ELECTROPHYSIOLOGY STUDY AND ABLATION  09/2020   failed   COSMETIC SURGERY     Breast implants x 2. Remove breast implants removed   CYSTOSCOPY     with antibiotic wash x3   D&C  11/05/2018   Polyps removed - pathology okay   Explant capsulectomy  2019   implants removed   MEDIAL COLLATERAL LIGAMENT REPAIR, KNEE  2017   x3   SPINE SURGERY     Cervial corpectomy and fusion    Current Outpatient Medications  Medication Sig Dispense Refill   acetaminophen (TYLENOL) 500 MG tablet Take by mouth.     buPROPion (WELLBUTRIN XL) 150 MG 24 hr tablet Take 150 mg by mouth daily.     carvedilol (COREG) 12.5 MG tablet Take 1 tablet (12.5 mg total) by mouth 2 (two) times daily. 90 tablet 3   cetirizine (ZYRTEC) 5 MG tablet Take 10 mg by mouth 2 (two) times daily.     cloNIDine (CATAPRES) 0.1 MG tablet Take 1 tablet (0.1 mg total) by mouth every 12 (twelve) hours as needed for up to 3 days (hypertension with systolic BP over 536 mmHg). 6 tablet 0   HUMIRA PEN 40 MG/0.4ML PNKT Every 2 weeks     HYDROcodone-acetaminophen (NORCO/VICODIN) 5-325 MG tablet Take 1 tablet by mouth every 4 (four) hours as needed for up to 5 days for moderate pain. 12 tablet 0   MAGNESIUM PO Take 450 mg by mouth daily in the afternoon.      ondansetron (ZOFRAN-ODT) 4 MG disintegrating tablet Take 1 tablet (4 mg total) by mouth every 8 (eight) hours as needed for nausea or vomiting. 20 tablet 0   predniSONE (DELTASONE) 5 MG tablet Take by mouth.     pregabalin (LYRICA) 50 MG capsule Take 50 mg by mouth 2 (two) times daily.     traMADol (ULTRAM) 50 MG tablet Take 50 mg by mouth every 6 (six) hours as needed.     TURMERIC PO Take 1,200 mg by mouth 2 (two) times daily.      valACYclovir (VALTREX) 1000 MG tablet Take 500 mg by mouth as needed.     zolmitriptan (ZOMIG) 5 MG tablet Take 1 tablet (5 mg total) by mouth as needed for migraine. 27 tablet 3   zolpidem (AMBIEN) 10 MG tablet Take 1 tablet by mouth at bedtime.     No current facility-administered medications for this visit.    Allergies:   Hydrocodone-acetaminophen, Other, Codeine, Hydrocodone, and Oxycodone   Social History:  The patient  reports that she has never smoked. She has never used smokeless tobacco. She reports current alcohol use. She reports that she does not use drugs.   Family History:  family history includes Anxiety disorder in her mother; Arthritis in her paternal grandmother; Heart disease in her paternal grandfather; Hypertension in her father; Prostate cancer in her father.    Review of Systems: Review of Systems  Constitutional: Negative.   HENT: Negative.    Respiratory: Negative.    Cardiovascular: Negative.   Gastrointestinal: Negative.   Musculoskeletal: Negative.   Neurological: Negative.   Psychiatric/Behavioral: Negative.    All other systems reviewed and are negative.   PHYSICAL EXAM: VS:  BP (!) 160/90 (BP Location: Left Arm, Patient Position: Sitting, Cuff Size: Normal)   Pulse 69   Ht 5' 7.5" (1.715 m)   Wt 135 lb 2 oz (61.3 kg)   LMP 05/20/2023 (Approximate)   SpO2 97%   BMI 20.85 kg/m  , BMI Body mass index is 20.85 kg/m. Constitutional:  oriented to person, place, and time. No distress.  HENT:  Head: Grossly  normal Eyes:  no discharge. No scleral icterus.  Neck: No JVD, no carotid bruits  Cardiovascular: Regular rate and rhythm, no murmurs appreciated Pulmonary/Chest: Clear to auscultation bilaterally, no wheezes or rails Abdominal: Soft.  no distension.  no tenderness.  Musculoskeletal: Normal range of motion Neurological:  normal muscle tone. Coordination normal. No atrophy Skin: Skin warm and dry Psychiatric: normal affect, pleasant  Recent Labs: 06/29/2023: ALT 15; BUN 12; Creatinine, Ser 0.75; Hemoglobin 13.4; Platelets 218; Potassium 4.2; Sodium 137    Lipid Panel Lab Results  Component Value Date   CHOL 163 11/05/2017   HDL 74.00 11/05/2017   LDLCALC 80 11/05/2017   TRIG 43.0 11/05/2017    Wt Readings from Last 3 Encounters:  07/01/23 135 lb 2 oz (61.3 kg)  06/29/23 133 lb (60.3 kg)  06/24/23 137 lb 8 oz (62.4 kg)     ASSESSMENT AND PLAN:  Problem List Items Addressed This Visit       Cardiology Problems   Atrial tachycardia   Relevant Orders   EKG 12-Lead (Completed)   Paroxysmal tachycardia (HCC) - Primary   Relevant Orders   EKG 12-Lead (Completed)   Other Visit Diagnoses     Essential hypertension       Relevant Orders   EKG 12-Lead (Completed)      Paroxysmal tachycardia Prior work-up with EP at [facility] Attempt at ablation in the past unsuccessful Unable to tolerate flecainide, diltiazem Tachycardia symptoms relatively well-controlled on carvedilol  Chest pain Recent chest discomfort likely secondary to RA flare Typically good exercise tolerance, active most days no further work-up needed Unable to exclude microvascular diseaseHome  Anxiety Recommend follow-up with Dr. Sampson Goon  Rheumatoid arthritis Recent RA flare after holding Humira for several weeks Back on Humira, treated with prednisone taper managed by rheumatology Pain control with tramadol 50 up to 100 mg as needed    Total encounter time more than 30 minutes  Greater than 50%  was spent in counseling and coordination of care with the patient    Signed, Dossie Arbour, M.D., Ph.D. [facility] [place], [place] [phone]

## 2023-07-03 ENCOUNTER — Encounter: Payer: Self-pay | Admitting: Family Medicine

## 2023-07-03 ENCOUNTER — Ambulatory Visit (INDEPENDENT_AMBULATORY_CARE_PROVIDER_SITE_OTHER): Payer: BC Managed Care – PPO | Admitting: Family Medicine

## 2023-07-03 VITALS — BP 134/83 | HR 75 | Temp 98.2°F | Resp 12 | Ht 67.5 in | Wt 134.6 lb

## 2023-07-03 DIAGNOSIS — F411 Generalized anxiety disorder: Secondary | ICD-10-CM

## 2023-07-03 DIAGNOSIS — F4321 Adjustment disorder with depressed mood: Secondary | ICD-10-CM

## 2023-07-03 DIAGNOSIS — I10 Essential (primary) hypertension: Secondary | ICD-10-CM | POA: Diagnosis not present

## 2023-07-03 MED ORDER — HYDROXYZINE HCL 10 MG PO TABS: 10.0000 mg | ORAL_TABLET | Freq: Three times a day (TID) | ORAL | 0 refills | Status: DC | PRN

## 2023-07-03 MED ORDER — DULOXETINE HCL 20 MG PO CPEP: 20.0000 mg | ORAL_CAPSULE | Freq: Every day | ORAL | 3 refills | Status: DC
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
## 2023-07-11 MED ORDER — ESCITALOPRAM OXALATE 5 MG PO TABS: 5.0000 mg | ORAL_TABLET | Freq: Every day | ORAL | 1 refills | Status: DC

## 2023-07-14 ENCOUNTER — Other Ambulatory Visit: Payer: Self-pay

## 2023-07-14 MED ORDER — CLONIDINE HCL 0.1 MG PO TABS: 0.1000 mg | ORAL_TABLET | Freq: Two times a day (BID) | ORAL | 3 refills | Status: DC | PRN

## 2023-07-17 ENCOUNTER — Ambulatory Visit: Payer: BC Managed Care – PPO | Admitting: Family Medicine

## 2023-07-18 ENCOUNTER — Telehealth: Payer: Self-pay

## 2023-07-18 ENCOUNTER — Encounter: Payer: Self-pay | Admitting: Cardiovascular Disease

## 2023-07-18 DIAGNOSIS — R079 Chest pain, unspecified: Secondary | ICD-10-CM

## 2023-07-18 NOTE — Telephone Encounter (Signed)
Spoke w/ pt.  She reports that she was given carvelilol and clonidine at her last visit w/ instructions to take clonidine as an "emergency med". She reports that her BP is typically good in the am, 120/73, 116/73. If her BP is good, she will wait a few hours before taking meds so her numbers don't bottom out, but in the evenings, her BP runs high, 137/78, 167/100, 155/90 after carvedilol and clonidine. She reports constant SOB and chest tightness, like wearing a 50 lb vest, that even wakes her up at night.  She has been having toe tightness on her left foot, used to only happen in the shower, which she attributed to Raynaud's, but her neighbor noticed yesterday that her feet were purple. She denies any swelling in her feet or ankles, only pain is in 3 toes on left foot.  She is on antianxiety meds, so does not think CP & SOB are caused by this. She also reports dizziness for the past 5 weeks. She has appt @ [facility] on 10/2 for RA and would like to make sure her heart is not causing any issues so she can focus on RA at that time. Advised her that I will make Dr. Mariah Milling aware of her concerns and call her back w/ his recommendations.

## 2023-07-18 NOTE — Telephone Encounter (Signed)
Per MyChart message: o wanted to follow up with a concern. I'm still a  bit concerned. I have chest pain and pressure almost all the time . It wakes me up. My feet look blue and grey a lot and my toes feel super tight in the shower like there is a rubber band around them . I'm also dizzy a lot . I know that the Rheumatoid Arthritis can sometimes cause pulmonary hypertension. Is this a possibility? I seem to have a lot of the symptoms well most of them actually. It just hasn't seemed to get better. Thanks very much . Take care Martha [name]

## 2023-07-21 ENCOUNTER — Telehealth: Payer: Self-pay | Admitting: Cardiovascular Disease

## 2023-07-21 NOTE — Telephone Encounter (Signed)
Left voicemail, pt needs echo scheduled

## 2023-07-21 NOTE — Telephone Encounter (Signed)
Called patient and notified her of the following from Dr. Mariah Milling.  We can order baseline echocardiogram This can help exclude pulmonary hypertension and other cardiac etiology such as pericardial effusion Thx TGollan       Patient verbalizes understanding.

## 2023-07-22 ENCOUNTER — Encounter: Payer: Self-pay | Admitting: Physician Assistant

## 2023-07-22 ENCOUNTER — Ambulatory Visit: Payer: BC Managed Care – PPO | Attending: Physician Assistant | Admitting: Physician Assistant

## 2023-07-22 VITALS — BP 130/78 | HR 60 | Ht 67.0 in | Wt 133.2 lb

## 2023-07-22 DIAGNOSIS — R002 Palpitations: Secondary | ICD-10-CM

## 2023-07-22 DIAGNOSIS — R072 Precordial pain: Secondary | ICD-10-CM

## 2023-07-22 DIAGNOSIS — I4719 Other supraventricular tachycardia: Secondary | ICD-10-CM | POA: Diagnosis not present

## 2023-07-22 DIAGNOSIS — I10 Essential (primary) hypertension: Secondary | ICD-10-CM | POA: Diagnosis not present

## 2023-07-22 MED ORDER — AMLODIPINE BESYLATE 2.5 MG PO TABS: 2.5000 mg | ORAL_TABLET | Freq: Every day | ORAL | 3 refills | Status: DC

## 2023-07-22 MED ORDER — METOPROLOL TARTRATE 50 MG PO TABS: ORAL_TABLET | ORAL | 0 refills | Status: DC

## 2023-07-22 NOTE — Patient Instructions (Signed)
Medication Instructions:  Your physician recommends the following medication changes.  START TAKING: Amlodipine 2.5 mg between 2 and 3 pm daily  Metoprolol 50 mg 60-90 minutes prior to procedure   *If you need a refill on your cardiac medications before your next appointment, please call your pharmacy*   Lab Work: Your provider would like for you to have following labs drawn today BMP.   If you have labs (blood work) drawn today and your tests are completely normal, you will receive your results only by: MyChart Message (if you have MyChart) OR A paper copy in the mail If you have any lab test that is abnormal or we need to change your treatment, we will call you to review the results.   Testing/Procedures:   Your cardiac CT will be scheduled at:   [facility] [address] [place], [place] [address] [phone]  [facility], please arrive 15 mins early for check-in and test prep.  Please follow these instructions carefully (unless otherwise directed):  An IV will be required for this test and Nitroglycerin will be given.   On the Night Before the Test: Be sure to Drink plenty of water. Do not consume any caffeinated/decaffeinated beverages or chocolate 12 hours prior to your test. Do not take any antihistamines 12 hours prior to your test.  On the Day of the Test: Drink plenty of water until 1 hour prior to the test. Do not eat any food 1 hour prior to test. You may take your regular medications prior to the test.  Take metoprolol (Lopressor) two hours prior to test. FEMALES- please wear underwire-free bra if available, avoid dresses & tight clothing  *For Clinical Staff only. Please instruct patient the following:* Heart Rate Medication Recommendations for Cardiac CT  Resting HR 60-65 bpm and BP >110/50 mmHG  Metoprolol tartrate 50 mg PO 90-120 minutes prior to scan        After the  Test: Drink plenty of water. After receiving IV contrast, you may experience a mild flushed feeling. This is normal. On occasion, you may experience a mild rash up to 24 hours after the test. This is not dangerous. If this occurs, you can take Benadryl 25 mg and increase your fluid intake. If you experience trouble breathing, this can be serious. If it is severe call 911 IMMEDIATELY. If it is mild, please call our office. If you take any of these medications: Glipizide/Metformin, Avandament, Glucavance, please do not take 48 hours after completing test unless otherwise instructed.  We will call to schedule your test 2-4 weeks out understanding that some insurance companies will need an authorization prior to the service being performed.   For more information and frequently asked questions, please visit our website : [email or web address]  For non-scheduling related questions, please contact the cardiac imaging nurse navigator should you have any questions/concerns: Cardiac Imaging Nurse Navigators Direct Office Dial: [phone]   For scheduling needs, including cancellations and rescheduling, please call [place], [phone].    Follow-Up: At [facility], you and your health needs are our priority.  As part of our continuing mission to provide you with exceptional heart care, we have created designated Provider Care Teams.  These Care Teams include your primary Cardiologist (physician) and Advanced Practice Providers (APPs -  Physician Assistants and Nurse Practitioners) who all work together to provide you with the care you need, when you need it.  We recommend signing up for the patient portal  called "MyChart".  Sign up information is provided on this After Visit Summary.  MyChart is used to connect with patients for Virtual Visits (Telemedicine).  Patients are able to view lab/test results, encounter notes, upcoming appointments, etc.  Non-urgent messages can be  sent to your provider as well.   To learn more about what you can do with MyChart, go to [email or web address].    Your next appointment:   2 to 3  month(s)  Provider:   You may see Julien Nordmann, MD or one of the following Advanced Practice Providers on your designated Care Team:

## 2023-07-22 NOTE — Progress Notes (Signed)
Cardiology Office Note    Date:  07/22/2023   ID:  Martha [name], DOB Feb 24, 1971, MRN [id number]  PCP:  Erasmo Downer, MD  Cardiologist:  Julien Nordmann, MD  Electrophysiologist:  None   Chief Complaint: Elevated blood pressure and chest tightness  History of Present Illness:   Martha [name] is a 52 y.o. female with history of atrial tachycardia previously on flecainide with noninducible EPS, HTN, rheumatoid arthritis, chronic chest pain, prior tobacco use, and anxiety who presents for evaluation of elevated blood pressure and chest tightness.  She was previously evaluated by [facility] cardiology, [facility]ology, and more recently has transitioned her care to [facility] with Dr. Mariah Milling in 2022.  She experienced near syncope with associated palpitations and chest fullness in [place] in 07/2020.  She was under increased stress at that time surrounding her daughter's wedding and her grandmother's death.  During these episodes she noted elevated BP readings in the 200s over 90s.  At that time, she was incidentally found to have rheumatoid arthritis.  In the setting of the above, she has undergone multiple outpatient cardiac monitors that have demonstrated atrial and ventricular ectopy as well as brief paroxysms of atrial tachycardia, as outlined below.  She did not tolerate metoprolol secondary to fatigue.  Echo in 08/2020 at [facility] showed an EF greater than 55% with mild posterior mitral valve prolapse with mild mitral regurgitation and normal RV systolic function and ventricular cavity size.  She underwent EPS in 09/2020, though arrhythmia was not inducible, even with isoproterenol.  Given continued symptoms, she was initiated on flecainide, though this medication was ultimately discontinued due to fatigue.  In this setting, she was initiated on diltiazem, which initially improved her symptoms, though plateaued in terms of efficacy.  CTA of the chest in 12/2020 negative for PE with no  evidence of coronary artery calcification on imaging.  Echo in 06/2021 showed an EF greater than 55%, normal wall motion, mild mitral regurgitation, mild tricuspid regurgitation, and normal RV systolic function.  She was seen in the ED 06/29/2023 with elevated blood pressure readings and generalized pain.  Blood pressure 210/110 trending to 170/88.  High-sensitivity troponin negative x 2.  Treated with clonidine, lorazepam, and Dilaudid.  She was seen in the office on 07/01/2023 noting an improvement in blood pressure readings with improved pain.  Blood pressure elevated at 160/90 at that time.  No changes were made.  She was seen in the [facility] ED on 07/20/2023 with elevated BP readings in the 200s over 100s with associated chest tightness.  High-sensitivity troponin negative x 2.  CTA was negative for aortic dissection or acute abnormality in the chest, abdomen, or pelvis.  Unable to review images, and no mention of coronary artery calcification on report.  Blood pressure improved in the ED at 166/97.  She comes in today for evaluation of elevated BP readings, particularly in the afternoon/evening hours as well as exacerbations of chronic chest discomfort.  As noted above, patient has recently been evaluated in the ED several times.  Currently takes carvedilol 12.1 mg twice daily typically around 10:30 to 11 in the morning and evening hours.  Historically, she was needing as needed clonidine intermittently, several hours prior to her evening dose of carvedilol.  However, more recently she has noted elevations in her blood pressure in the afternoon hours prompting her to take a as needed clonidine.  With this, she is quite fatigued and has not really noted an improvement in her  blood pressure readings with readings largely in the 140s to 160s despite as needed clonidine.  No new dietary changes.  The only change medically is that she is not currently on Humira.  She drinks 2 cups of half caffeinated coffee  daily, does not drink alcohol or use tobacco.  No illicit substances.  When her blood pressure is elevated she notes exacerbation of her chronic chest pain.  Scheduled for echo with primary cardiologist next month.   Labs independently reviewed: 06/2023 - Hgb 12.8, PLT 174, potassium 4.3, BUN 8, serum creatinine 0.68, albumin 4.0, AST/ALT normal 05/2023 - magnesium 2.1 07/2020 - TSH normal 02/2020 - TC 164, TG 70, HDL 84, LDL 66  Past Medical History:  Diagnosis Date   ADHD (attention deficit hyperactivity disorder)    Allergy    Anxiety    Atrial tachycardia    Depression    Hypertension    Insomnia    Interstitial cystitis    Migraine headache    Rheumatoid arthritis (HCC)     Past Surgical History:  Procedure Laterality Date   AUGMENTATION MAMMAPLASTY Bilateral    implants removed   BREAST SURGERY  1995, 2016   CARDIAC ELECTROPHYSIOLOGY STUDY AND ABLATION  09/2020   failed   COSMETIC SURGERY     Breast implants x 2. Remove breast implants removed   CYSTOSCOPY     with antibiotic wash x3   D&C  11/05/2018   Polyps removed - pathology okay   Explant capsulectomy  2019   implants removed   MEDIAL COLLATERAL LIGAMENT REPAIR, KNEE  2017   x3   SPINE SURGERY     Cervial corpectomy and fusion    Current Medications: Current Meds  Medication Sig   acetaminophen (TYLENOL) 500 MG tablet Take by mouth.   amLODipine (NORVASC) 2.5 MG tablet Take 1 tablet (2.5 mg total) by mouth daily. Between the hours of 2 and 3 pm   buPROPion (WELLBUTRIN XL) 150 MG 24 hr tablet Take 150 mg by mouth daily.   carvedilol (COREG) 12.5 MG tablet Take 1 tablet (12.5 mg total) by mouth 2 (two) times daily.   cetirizine (ZYRTEC) 5 MG tablet Take 10 mg by mouth 2 (two) times daily.   cloNIDine (CATAPRES) 0.1 MG tablet Take 1 tablet (0.1 mg total) by mouth every 12 (twelve) hours as needed (hypertension with systolic BP over 409 mmHg).   escitalopram (LEXAPRO) 5 MG tablet Take 1 tablet (5 mg total) by  mouth daily.   HUMIRA PEN 40 MG/0.4ML PNKT Every 2 weeks   hydrOXYzine (ATARAX) 10 MG tablet Take 1 tablet (10 mg total) by mouth 3 (three) times daily as needed for anxiety.   MAGNESIUM PO Take 450 mg by mouth daily in the afternoon.   metoprolol tartrate (LOPRESSOR) 50 MG tablet TAKE 1 TABLET 60-90 MINUTES PRIOR TO CARDIAC PROCEDURE   ondansetron (ZOFRAN-ODT) 4 MG disintegrating tablet Take 1 tablet (4 mg total) by mouth every 8 (eight) hours as needed for nausea or vomiting.   pregabalin (LYRICA) 50 MG capsule Take 50 mg by mouth 2 (two) times daily.   traMADol (ULTRAM) 50 MG tablet Take 50 mg by mouth every 6 (six) hours as needed.   TURMERIC PO Take 1,200 mg by mouth 2 (two) times daily.    valACYclovir (VALTREX) 1000 MG tablet Take 500 mg by mouth as needed.   zolmitriptan (ZOMIG) 5 MG tablet Take 1 tablet (5 mg total) by mouth as needed for migraine.   zolpidem (  AMBIEN) 10 MG tablet Take 1 tablet by mouth at bedtime.    Allergies:   Hydrocodone-acetaminophen, Other, Codeine, Hydrocodone, and Oxycodone   Social History   Socioeconomic History   Marital status: Married    Spouse name: Not on file   Number of children: 2   Years of education: Not on file   Highest education level: Bachelor's degree (e.g., BA, AB, BS)  Occupational History   Not on file  Tobacco Use   Smoking status: Never   Smokeless tobacco: Never  Vaping Use   Vaping status: Never Used  Substance and Sexual Activity   Alcohol use: Yes    Comment: occasional 1x per month on avg   Drug use: No   Sexual activity: Yes    Partners: Male    Comment: Husband had vasectomy  Other Topics Concern   Not on file  Social History Narrative   Not on file   Social Determinants of Health   Financial Resource Strain: Low Risk  (06/23/2023)   Overall Financial Resource Strain (CARDIA)    Difficulty of Paying Living Expenses: Not hard at all  Food Insecurity: No Food Insecurity (06/23/2023)   Hunger Vital Sign     Worried About Running Out of Food in the Last Year: Never true    Ran Out of Food in the Last Year: Never true  Transportation Needs: No Transportation Needs (06/23/2023)   PRAPARE - Administrator, Civil Service (Medical): No    Lack of Transportation (Non-Medical): No  Physical Activity: Insufficiently Active (06/23/2023)   Exercise Vital Sign    Days of Exercise per Week: 3 days    Minutes of Exercise per Session: 20 min  Stress: Stress Concern Present (06/23/2023)   [employer] of Occupational Health - Occupational Stress Questionnaire    Feeling of Stress : Very much  Social Connections: Socially Integrated (06/23/2023)   Social Connection and Isolation Panel [NHANES]    Frequency of Communication with Friends and Family: More than three times a week    Frequency of Social Gatherings with Friends and Family: Twice a week    Attends Religious Services: 1 to 4 times per year    Active Member of [employer] or Organizations: Yes    Attends Banker Meetings: 1 to 4 times per year    Marital Status: Married     Family History:  The patient's family history includes Anxiety disorder in her mother; Arthritis in her paternal grandmother; Heart disease in her paternal grandfather; Hypertension in her father; Prostate cancer in her father. There is no history of Breast cancer.  ROS:   12-point review of systems is negative unless otherwise noted in the HPI.   EKGs/Labs/Other Studies Reviewed:    Studies reviewed were summarized above. The additional studies were reviewed today:  CTA aortic dissection 07/20/2023 [facility]): FINDINGS:   AORTA: Normal caliber aorta. No thoracic aortic intramural hematoma.  No aortic dissection.   CHEST: Normal heart size.  No pericardial effusion. No mediastinal lymphadenopathy. Clear central airways. No consolidation.  No pleural effusion.   HEPATOBILIARY: No focal hepatic lesions. The gallbladder is normal in appearance. No biliary  dilatation.    SPLEEN: Unremarkable.   PANCREAS: Normal pancreatic contour without signs of inflammation or gross ductal dilatation.   ADRENALS: Normal appearance.   KIDNEYS/URETERS: Symmetric renal enhancement. No solid lesion. No hydronephrosis.   BLADDER: Decompressed.   PELVIC/REPRODUCTIVE ORGANS: Anteflexed unremarkable uterus. No suspicious adnexal mass.   GI TRACT: No  dilated or thick walled loops of bowel.   PERITONEUM/RETROPERITONEUM AND MESENTERY: No free air or fluid.   LYMPH NODES: No enlarged lymph nodes.   BONES/SOFT TISSUES: No suspicious osseous lesions. Soft tissues unremarkable.   Impression: -- No evidence of acute aortic injury.  -- No acute abnormality in the chest, abdomen, or pelvis.  __________  2D echo 07/23/2021 [facility]): INTERPRETATION  NORMAL LEFT VENTRICULAR SYSTOLIC FUNCTION  NORMAL RIGHT VENTRICULAR SYSTOLIC FUNCTION  MILD VALVULAR REGURGITATION (See above)  NO VALVULAR STENOSIS  ESTIMATED LVEF >55%  Aortic: NORMAL  Mitral: MILD MR  Tricuspid: MILD TR (2.22m/s)  Pulmonic: TRIVIAL PI  __________  48-hour Holter 05/2021 ([facility]): Conclusions: 1)  This 48 hour Holter scan was adequate for interpretation. 2)  The patient was in sinus rhythm, sinus bradycardia, and sinus arrhythmia (strip 13). Average HR: 65 bpm.  3)  Ventricular ectopy consisted of 8 multifocal PVCs (strips 7,17).  4)  Supraventricular ectopy consisted of 17 PACs (strips 5,15). 5)  There were no pauses greater than 2.0 seconds noted. 6)  There were no symptoms noted in diary, however event button activations correlated with sinus bradycardia (strip 6), sinus rhythm   (strips 8,9,10,11,12,14,16,18,19,21,22,23,24,25,26) and sinus rhythm with PACs (strip 20).    ___________  Outpatient cardiac monitoring 11/2020 ([facility]): IMPRESSION:  *The predominant rhythm was sinus bradycardia to sinus tachycardia. *The Maximum Heart Rate recorded was 103 bpm, Day 2 / 06:16:09 am, the Minimum Heart  Rate recorded was 46 bpm, Day 8 / 04:05:47 am and the Average Heart Rate was 65 bpm. *There were 20  PVCs with a burden of < 0.01 %. *There were 58 PSVCs with a burden of < 0.01 %. *There were 15 Patient triggered events, all showing sinus rhythm.  On 10/15 eveints, a PVC(9) or PAC(1) was seen just prior to activation.  __________  48-hour Holter 09/2020 ([facility]): Conclusions: The predominant rhythm was sinus with an average heart rate of 69 bpm.  The minimum heart rate was 47 bpm with a maximum heart rate of 104 bpm.  Atrial and ventricular ectopy were rare.  There were no pauses greater than 2 seconds.  No  symptoms were reported by the patient.  Monitor quality was adequate.  __________  EPS 10/05/2020 ([facility]): CONCLUSIONS:   1.  No inducible SVT  2.  Likely diagnosis is atrial tachycardia based on prior holter  3.  No inducible arrhythmia on isoproteranol  4.  Normal opening and closing intervals.  __________  48-hour Holter 09/2020 ([facility]): Conclusions: The predominant rhythm was sinus with an average heart rate of 69 bpm.  The minimum heart rate was 49 bpm with a maximum heart rate of 92 bpm.  Atrial and ventricular ectopy were very rare.  There were no pauses of greater than 2 seconds and   there were no episodes of AV block.  The patient activated the monitor on 8 occasions without listing any specific symptoms.  On each occasion, the rhythm was sinus with no ectopy.  Monitor quality was adequate.  __________  52 year old 08/2020 ([facility]): Conclusions: The predominant rhythm was sinus with an average heart rate of 63 bpm.  The minimum heart rate was 50 bpm with a maximum heart rate of 89 bpm.  Atrial and ventricular ectopy were rare.  There were no pauses greater than 2 seconds and there  were no episodes of AV block.  On about 30 occasions, the patient complained of palpitation and on each occasion, the rhythm was sinus with no ectopy.  Monitor quality was adequate.   __________  Outpatient cardiac monitor 08/2020 [facility]): Patient had a min HR of 40 bpm, max HR of 174 bpm, and avg HR of 59 bpm. Predominant underlying rhythm was Sinus Rhythm. 1813 Supraventricular Tachycardia runs occurred, the run with the fastest interval lasting 5 beats with a max rate of 174 bpm, the longest lasting 20.9 secs with an avg rate of 117 bpm. Supraventricular Tachycardia was detected within +/- 45 seconds of symptomatic patient event(s). Isolated SVEs were occasional (4.9%, 50162), SVE Couplets were occasional (1.9%, 9974), and SVE Triplets were occasional (1.4%, 4616). Isolated VEs were rare (<1.0%), and no VE Couplets or VE Triplets were present. Some VEs may be SVEs with possible aberrancy.  __________  2D echo 08/29/2020 [facility]): Summary    1. The left ventricle is normal in size with normal wall thickness.    2. The left ventricular systolic function is normal, LVEF is visually  estimated at > 55%.    3. Mild posterior mitral valve prolapse.    4. There is mild mitral valve regurgitation.    5. The right ventricle is normal in size, with normal systolic function.  __________  Cardiac event monitor 02/2017 [facility] Rhythm Findings:     1.  Ventricular: rare PVCs   2.  Supraventricular: No None   3.  Bradyarrhythmias and Pauses: No   Symptoms reported:  "flutter" or "skip beats"   CONCLUSIONS:  1. Essentially normal, no significant abnormalities noted.   2.  Rare PVCs noted, majorities are asymptomatic.  Occasionally felt as "flutter"     EKG:  EKG is not ordered today.    Recent Labs: 06/29/2023: ALT 15; BUN 12; Creatinine, Ser 0.75; Hemoglobin 13.4; Platelets 218; Potassium 4.2; Sodium 137  Recent Lipid Panel    Component Value Date/Time   CHOL 163 11/05/2017 1540   TRIG 43.0 11/05/2017 1540   HDL 74.00 11/05/2017 1540   CHOLHDL 2 11/05/2017 1540   VLDL 8.6 11/05/2017 1540   LDLCALC 80 11/05/2017 1540    PHYSICAL EXAM:    VS:  BP 130/78 (BP Location:  Left Arm, Patient Position: Sitting, Cuff Size: Normal)   Pulse 60   Ht 5\' 7"  (1.702 m)   Wt 133 lb 3.2 oz (60.4 kg)   SpO2 100%   BMI 20.86 kg/m   BMI: Body mass index is 20.86 kg/m.  Physical Exam Vitals reviewed.  Constitutional:      Appearance: She is well-developed.  HENT:     Head: Normocephalic and atraumatic.  Eyes:     General:        Right eye: No discharge.        Left eye: No discharge.  Cardiovascular:     Rate and Rhythm: Normal rate and regular rhythm.     Pulses:          Posterior tibial pulses are 2+ on the right side and 2+ on the left side.     Heart sounds: Normal heart sounds, S1 normal and S2 normal. Heart sounds not distant. No midsystolic click and no opening snap. No murmur heard.    No friction rub.  Pulmonary:     Effort: Pulmonary effort is normal. No respiratory distress.     Breath sounds: Normal breath sounds. No decreased breath sounds, wheezing, rhonchi or rales.  Chest:     Chest wall: No tenderness.  Abdominal:     General: There is no distension.  Musculoskeletal:     Cervical back:  Normal range of motion.     Right lower leg: No edema.     Left lower leg: No edema.  Skin:    General: Skin is warm and dry.     Nails: There is no clubbing.  Neurological:     Mental Status: She is alert and oriented to person, place, and time.  Psychiatric:        Speech: Speech normal.        Behavior: Behavior normal.        Thought Content: Thought content normal.        Judgment: Judgment normal.     Wt Readings from Last 3 Encounters:  07/22/23 133 lb 3.2 oz (60.4 kg)  07/03/23 134 lb 9.6 oz (61.1 kg)  07/01/23 135 lb 2 oz (61.3 kg)     ASSESSMENT & PLAN:   HTN: Blood pressure is well-controlled in the office this morning at 130/78.  However, she continues to note fluctuations in blood pressure readings, particularly in the afternoon hours.  With this, she has needed as needed clonidine more frequently, though has not noted a  significant improvement in blood pressure readings with this with notable off target effects of fatigue.  In this setting we will have her take amlodipine 2.5 mg around 2 or 3 PM with continuation of carvedilol 12.5 mg twice daily and as needed clonidine.  Unable to escalate beta-blocker any further with a heart rate of 60 bpm in the office today.  Precordial pain: Currently chest pain-free.  Multiple negative high-sensitivity troponins on file for review.  Reports a longstanding history of chronic chest pain that is worsened with elevated BP readings.  Schedule coronary CTA.  Maintain optimal blood pressure control as outlined above.  Palpitations/atrial tachycardia: Not discussed in detail today.  Remains on carvedilol.      Disposition: F/u with Dr. Mariah Milling in 2-3 months   Medication Adjustments/Labs and Tests Ordered: Current medicines are reviewed at length with the patient today.  Concerns regarding medicines are outlined above. Medication changes, Labs and Tests ordered today are summarized above and listed in the Patient Instructions accessible in Encounters.   Signed, Eula Listen, PA-C 07/22/2023 2:09 PM     [facility] [place] [address] [place], [place] [address] [phone]

## 2023-07-22 NOTE — Telephone Encounter (Signed)
Called and spoke with patient. Patient requesting an appointment. Patient states that she has been to the ED three times for hypertension. Patient states that she took her prn Clonidine yesterday but her systolic blood pressure was still in 150's. Patient scheduled with Eula Listen, PA-C 07/22/23.

## 2023-07-22 NOTE — Telephone Encounter (Signed)
   Patient called after hours line with concerns about elevated BP.  She was seen by Eula Listen, PA-C, in the office today with similar concerns.  She takes Coreg 12.5 mg twice daily and then has as needed Clonidine.  She usually has to take the Clonidine a couple of hours before her evening dose of Coreg.  However, lately patient has been needing to take this much earlier in the day.  She was started on Amlodipine 2.5 mg daily after today's visit with instructions to take it around 2 to 3 PM in the hopes that this would help stabilize her BP throughout the day so that she does not need the Clonidine.  Patient states she took the Amlodipine this afternoon as instructed and initially felt great.  However, as the afternoon progressed she has started to feel nauseous and sweaty which is usually how she feels when her BP is high.  She checked her  BP around 5 PM and systolic BP was 155.  She then rechecked her BP again right before calling and systolic peak BP was in the 170s.  She states Clonidine completely wiped her out when she takes this so I would like to avoid this if possible  Recommended taking another dose of Amlodipine 2.5 mg since she tolerated this well earlier today and then she can take her evening dose of Coreg around 10 PM as usual.  Discussed that she may end up needing 5 mg of Amlodipine in the afternoon for better BP control but asked her to monitor this closely and just keep [place] updated.  Patient voiced understanding and thanked me for calling.  Corrin Parker, PA-C 07/22/2023 7:26 PM

## 2023-07-26 ENCOUNTER — Telehealth: Payer: Self-pay | Admitting: Internal Medicine

## 2023-07-26 NOTE — Telephone Encounter (Signed)
Patient contacted the after hour cardiology line discuss a blood pressure being 185/95 mmHg.  She took her amlodipine and the systolic blood pressure eventually went down to the 150s.  Patient informed to take here carvedilol early.  At this time, she is stable without any acute symptoms.

## 2023-07-28 ENCOUNTER — Ambulatory Visit (HOSPITAL_COMMUNITY): Payer: BC Managed Care – PPO | Attending: Cardiovascular Disease

## 2023-07-28 DIAGNOSIS — R079 Chest pain, unspecified: Secondary | ICD-10-CM | POA: Diagnosis present

## 2023-07-28 LAB — ECHOCARDIOGRAM COMPLETE
Area-P 1/2: 5.23 cm2
S' Lateral: 2.6 cm

## 2023-07-29 ENCOUNTER — Encounter: Payer: Self-pay | Admitting: Family Medicine

## 2023-07-30 ENCOUNTER — Other Ambulatory Visit: Payer: Self-pay

## 2023-07-30 NOTE — Telephone Encounter (Signed)
Patient requesting for 90 days.

## 2023-07-30 NOTE — Telephone Encounter (Signed)
Refill request send to provider.

## 2023-07-31 ENCOUNTER — Encounter (HOSPITAL_COMMUNITY): Payer: Self-pay

## 2023-07-31 ENCOUNTER — Encounter: Payer: Self-pay | Admitting: Family Medicine

## 2023-07-31 ENCOUNTER — Ambulatory Visit: Payer: Self-pay | Admitting: *Deleted

## 2023-07-31 MED ORDER — ZOLPIDEM TARTRATE 10 MG PO TABS: 10.0000 mg | ORAL_TABLET | Freq: Every day | ORAL | 3 refills | Status: AC

## 2023-07-31 NOTE — Telephone Encounter (Signed)
  Chief Complaint: urinary frequency, burning with urination Symptoms: see above. Last UTI 6 years ago. B. urning with every urination Frequency: 3 days  Pertinent Negatives: Patient denies fever no blood in urine  Disposition: [] ED /[] Urgent Care (no appt availability in office) / [] Appointment(In office/virtual)/ []  [facility]/ [] Home Care/ [] Refused Recommended Disposition /[] [facility] [facility]/ [x]  Follow-up with PCP Additional Notes:   Patient requesting if she can come in to submit a sample and not have to make appt. Recommended appt. Please advise if patient can submit sample without appt.   Summary: UTI   Pt is calling in because she has symptoms of a UTI and wants to drop a urine specimen off. Pt does not want an appointment.            Reason for Disposition  Age > 50 years  Answer Assessment - Initial Assessment Questions 1. SEVERITY: "How bad is the pain?"  (e.g., Scale 1-10; mild, moderate, or severe)   - MILD (1-3): complains slightly about urination hurting   - MODERATE (4-7): interferes with normal activities     - SEVERE (8-10): excruciating, unwilling or unable to urinate because of the pain      Moderate  2. FREQUENCY: "How many times have you had painful urination today?"      Every urination 3. PATTERN: "Is pain present every time you urinate or just sometimes?"      Yes  4. ONSET: "When did the painful urination start?"      3 days  5. FEVER: "Do you have a fever?" If Yes, ask: "What is your temperature, how was it measured, and when did it start?"     Na  6. PAST UTI: "Have you had a urine infection before?" If Yes, ask: "When was the last time?" and "What happened that time?"      Yes 6 years ago  7. CAUSE: "What do you think is causing the painful urination?"  (e.g., UTI, scratch, Herpes sore)     UTI 8. OTHER SYMPTOMS: "Do you have any other symptoms?" (e.g., blood in urine, flank pain, genital sores, urgency, vaginal discharge)      Pain with urination, frequency  9. PREGNANCY: "Is there any chance you are pregnant?" "When was your last menstrual period?"     na  Protocols used: Urination Pain - Female-A-AH

## 2023-07-31 NOTE — Telephone Encounter (Signed)
Responded to patient an office visit is needed via [employer]

## 2023-08-01 ENCOUNTER — Other Ambulatory Visit: Payer: Self-pay | Admitting: Family Medicine

## 2023-08-01 DIAGNOSIS — I10 Essential (primary) hypertension: Secondary | ICD-10-CM

## 2023-08-01 DIAGNOSIS — Z79899 Other long term (current) drug therapy: Secondary | ICD-10-CM

## 2023-08-01 DIAGNOSIS — M79605 Pain in left leg: Secondary | ICD-10-CM

## 2023-08-01 DIAGNOSIS — Z1212 Encounter for screening for malignant neoplasm of rectum: Secondary | ICD-10-CM

## 2023-08-01 DIAGNOSIS — Z1211 Encounter for screening for malignant neoplasm of colon: Secondary | ICD-10-CM

## 2023-08-04 ENCOUNTER — Ambulatory Visit: Payer: 59

## 2023-08-04 MED ORDER — AMLODIPINE BESYLATE 5 MG PO TABS: 7.5000 mg | ORAL_TABLET | Freq: Every day | ORAL | 3 refills | Status: DC

## 2023-08-06 ENCOUNTER — Telehealth (HOSPITAL_COMMUNITY): Payer: Self-pay | Admitting: *Deleted

## 2023-08-06 NOTE — Telephone Encounter (Signed)
Reaching out to patient to offer assistance regarding upcoming cardiac imaging study; pt verbalizes understanding of appt date/time, parking situation and where to check in, pre-test NPO status and medications ordered, and verified current allergies; name and call back number provided for further questions should they arise  Larey Brick RN Navigator Cardiac Imaging [facility] [phone] office [phone] cell  Patient to hold her AM carvedilol and take 50mg  metoprolol tartrate two hours prior to her cardiac CT scan.

## 2023-08-07 ENCOUNTER — Ambulatory Visit
Admission: RE | Admit: 2023-08-07 | Discharge: 2023-08-07 | Disposition: A | Discharge status: Home / Self Care | Payer: BC Managed Care – PPO | Source: Ambulatory Visit | Attending: Physician Assistant | Admitting: Physician Assistant

## 2023-08-07 DIAGNOSIS — R072 Precordial pain: Secondary | ICD-10-CM | POA: Diagnosis not present

## 2023-08-07 MED ORDER — IOHEXOL 350 MG/ML SOLN
75.0000 mL | Freq: Once | INTRAVENOUS | Status: AC | PRN
  Administered 2023-08-07: 75 mL via INTRAVENOUS

## 2023-08-07 MED ORDER — NITROGLYCERIN 0.4 MG SL SUBL
0.8000 mg | SUBLINGUAL_TABLET | Freq: Once | SUBLINGUAL | Status: AC
  Administered 2023-08-07: 0.8 mg via SUBLINGUAL

## 2023-08-07 MED ORDER — SODIUM CHLORIDE 0.9 % IV SOLN: INTRAVENOUS | Status: DC

## 2023-08-07 NOTE — Progress Notes (Signed)
Pt tolerated procedure well with no issues. Pt ABCs intact. Pt denies any complaints. Pt encouraged to drink plenty of water throughout the day. Pt ambulatory with steady gait.

## 2023-08-08 ENCOUNTER — Other Ambulatory Visit: Payer: 59

## 2023-08-12 MED ORDER — AMLODIPINE BESYLATE 5 MG PO TABS: 5.0000 mg | ORAL_TABLET | Freq: Every day | ORAL | 3 refills | Status: DC

## 2023-08-12 MED ORDER — CHLORTHALIDONE 25 MG PO TABS: 12.5000 mg | ORAL_TABLET | Freq: Every day | ORAL | 3 refills | Status: DC

## 2023-08-12 NOTE — Telephone Encounter (Signed)
The patient has been notified of the the plan of care along with Ryan's recommendations. Pt verbalized understanding. All questions were answered.  Pt reports that she will take the Chlorthalidone 12.5 mg daily between 2-3 pm  Pt did report that the day she took the metoprolol that she "felt 25% better"   Pt also reporting that yesterday daily "long walk" has helped with her evening spike of blood pressure.  Pt reminded of upcoming blood work appt and encouraged to call if any concerns or questions arise

## 2023-08-13 ENCOUNTER — Ambulatory Visit: Admission: RE | Admit: 2023-08-13 | Payer: 59 | Source: Ambulatory Visit

## 2023-08-13 LAB — COLOGUARD: COLOGUARD: NEGATIVE

## 2023-08-14 ENCOUNTER — Other Ambulatory Visit: Payer: Self-pay | Admitting: Neurosurgery

## 2023-08-14 ENCOUNTER — Ambulatory Visit: Payer: BC Managed Care – PPO | Admitting: Family Medicine

## 2023-08-14 DIAGNOSIS — G8929 Other chronic pain: Secondary | ICD-10-CM

## 2023-08-15 ENCOUNTER — Ambulatory Visit (HOSPITAL_COMMUNITY)
Admission: RE | Admit: 2023-08-15 | Discharge: 2023-08-15 | Disposition: A | Discharge status: Home / Self Care | Payer: BC Managed Care – PPO | Source: Ambulatory Visit | Attending: Physician Assistant | Admitting: Physician Assistant

## 2023-08-15 DIAGNOSIS — M79605 Pain in left leg: Secondary | ICD-10-CM

## 2023-08-15 NOTE — Addendum Note (Signed)
Addended by: Sandi Mariscal on: 08/15/2023 09:11 AM   Modules accepted: Orders

## 2023-08-15 NOTE — Telephone Encounter (Signed)
Called the patient to offer her a STAT venous doppler today in [place]. She has been made an appointment at 2 in the [facility] office. She is aware of directions.

## 2023-08-19 ENCOUNTER — Ambulatory Visit: Admission: RE | Admit: 2023-08-19 | Payer: BC Managed Care – PPO | Source: Ambulatory Visit

## 2023-08-20 ENCOUNTER — Encounter: Payer: Self-pay | Admitting: Cardiovascular Disease

## 2023-08-21 NOTE — Telephone Encounter (Signed)
Called patient and notified her of the following recommendation from Eula Listen, PA-C.  Needs office visit with primary cardiologist or DOD to assess further.    Patient verbalizes understanding. Patient scheduled for 08/25/23 with Dr. Mariah Milling.

## 2023-08-22 ENCOUNTER — Other Ambulatory Visit: Payer: Self-pay | Admitting: Obstetrics and Gynecology

## 2023-08-22 DIAGNOSIS — Z1231 Encounter for screening mammogram for malignant neoplasm of breast: Secondary | ICD-10-CM

## 2023-08-24 NOTE — Progress Notes (Unsigned)
Cardiology Office Note  Date:  08/25/2023   ID:  Martha [name], DOB 01-Feb-1971, MRN [id number]  PCP:  Erasmo Downer, MD   Chief Complaint  Patient presents with   Discuss Blood Pressure Medications     Patient c/o blood pressure fluctuating & swelling in ankles. Medications reviewed by the patient verbally.     HPI:  Martha [name] is a 52 year old woman with past medical history of Former smoker Atrial tachycardia Rheumatoid arthritis Chronic chest and body pain COVID 16 July 2021, treated with PACs elevated Fibromyalgia Who presents for follow-up of her chronic chest pain, paroxysmal tachycardia  Last office visit September 2024 Reports she was seen at [facility], was told her rheumatoid arthritis is under control Denies significant pain leading to hypertensive spells  She does feel blood pressure has been running a little bit high on her blood pressure cuff at home, often 140 systolic  Prior medication intolerances Did not tolerate metoprolol secondary to fatigue Flecainide discontinued secondary to fatigue Leg swelling on amlodipine, dose reduced down to 5 mg daily  Currently taking amlodipine 5 daily at 2 pm On chlorthalidone 12.5 daily at 2 pm Coreg 12.5 BID   Reports that she has a mass on uterus that needs to be resected  At home today 148 systolic Blood pressure on my check today 125 systolic  Other past medical history reviewed seen in the ED 06/29/2023 with elevated blood pressure readings and generalized pain.  Blood pressure 210/110 trending to 170/88.  High-sensitivity troponin negative x 2.  Treated with clonidine, lorazepam, and Dilaudid.    seen in the [facility] ED on 07/20/2023 with elevated BP readings in the 200s over 100s with associated chest tightness.  High-sensitivity troponin negative x 2.  CTA was negative for aortic dissection or acute abnormality in the chest, abdomen, or pelvis.    Normal cardiac CTA August 07, 2023 No significant coronary disease, calcium score 0  Echocardiogram September 2024, essentially normal study  In follow-up today reports having a difficult month or more Diagnosed with possible [place], treated with doxy Stopped humera during that time for several weeks Feels off the Humira she developed a RA flare Pain was bad Seen by rheum, restarted humera, prednisone taper Pain no better, Tried tramadol 50 mg as needed, pain not much better ABD pain, Bad bloating under ribs  Visit to the ER September 1  for high blood pressure, body pain Treated with Demerol, pain much better Received prescription for different pain medication which she has not tried yet, [facility] has it on back[facility]  Better blood pressure yesterday, numbers low, pain was better No pain meds today  She is continued on Coreg 12.5 BID  Scheduled to see neurosurgery and rheumatology in follow-up  October 2021 in [place] for her daughter's wedding, some chest fullness and palpitations, near syncope In and out of the emergency room [facility], PACs noted Followed by [facility] cardiology, initial visit November 2021 Driving to work had dizziness, chest tightness, near syncope, reported had been going on and off for 1 year or more Seen in the emergency room for palpitations, near syncope at that time Started on metoprolol 12.5 twice daily, did not get relief Notes indicating lots of stress, grandmother's death, daughter's wedding Essentially normal echocardiogram August 29, 2020, ordered for palpitations, PVCs Cardiac CTA essentially normal Monitor done at [facility], results not available  Seen by EP September 26, 2020, PACs,  Started on flecainide 50 twice daily Repeat monitor showing  patient triggered events not associated with significant arrhythmia Felt to have atrial tachycardia in follow-up September 21, 2020 at [facility] Although monitor appeared tachycardia had been suppressed Repeat 48-hour monitor ordered as she  reported she had not exerted herself to induce symptoms, no significant arrhythmia  Went for ablation of her atrial tachycardia October 05, 2020 Had no inducible SVT, felt to have atrial tachycardia based on prior Holter No other inducible arrhythmia on isoproterenol  Continued  tachycardia, palpitations, was started on diltiazem with some improvement of her symptoms Dose up to 180 daily because of fatigue, back to 120 daily  Work-up with pulmonary for shortness of breath symptoms including CT scans, PFTs were unremarkable  Cardiology office visit February 16, 2021 Numerous episodes of SVT 4-5 beats at a time, Sinus tachycardia noted Repeat 48-hour monitor was ordered Does not appear that she has had cardiology follow-up at [facility] since that time  Initial RA diagnosis made >2 years ago, presenting symptoms dry eyes,   CT chest March 2022 No PE, images pulled up and reviewed no significant coronary calcification, no aortic atherosclerosis  Symptoms from COVID on meeting with primary care July 11, 2021 reported cough, chest tightness, burning Treated with prednisone, inhaler, azithromycin Reports having heart rate 160s post covid, also chest pain  Continues to have tachycardia on exertion perhaps worse after COVID Back on flecainide 1 1/2 wks ago, stopped diltiazem Sx are doubled, chest pain, SOB, feels medications made it worse  PMH:   has a past medical history of ADHD (attention deficit hyperactivity disorder), Allergy, Anxiety, Atrial tachycardia (HCC), Depression, Hypertension, Insomnia, Interstitial cystitis, Migraine headache, and Rheumatoid arthritis (HCC).  PSH:    Past Surgical History:  Procedure Laterality Date   AUGMENTATION MAMMAPLASTY Bilateral    implants removed   BREAST SURGERY  1995, 2016   CARDIAC ELECTROPHYSIOLOGY STUDY AND ABLATION  09/2020   failed   COSMETIC SURGERY     Breast implants x 2. Remove breast implants removed   CYSTOSCOPY     with  antibiotic wash x3   D&C  11/05/2018   Polyps removed - pathology okay   Explant capsulectomy  2019   implants removed   MEDIAL COLLATERAL LIGAMENT REPAIR, KNEE  2017   x3   SPINE SURGERY     Cervial corpectomy and fusion    Current Outpatient Medications  Medication Sig Dispense Refill   acetaminophen (TYLENOL) 500 MG tablet Take 1,000 mg by mouth every 6 (six) hours as needed for mild pain (pain score 1-3) or headache.     amLODipine (NORVASC) 5 MG tablet Take 1 tablet (5 mg total) by mouth daily. Between the hours of 2 and 3 pm 90 tablet 3   buPROPion (WELLBUTRIN XL) 150 MG 24 hr tablet Take 150 mg by mouth daily.     carvedilol (COREG) 12.5 MG tablet Take 1 tablet (12.5 mg total) by mouth 2 (two) times daily. 90 tablet 3   cetirizine (ZYRTEC) 10 MG tablet Take 10 mg by mouth daily.     chlorthalidone (HYGROTON) 25 MG tablet Take 0.5 tablets (12.5 mg total) by mouth daily. 90 tablet 3   escitalopram (LEXAPRO) 5 MG tablet Take 1 tablet (5 mg total) by mouth daily. 30 tablet 1   estradiol (VIVELLE-DOT) 0.1 MG/24HR patch Place 1 patch onto the skin 2 (two) times a week.     HUMIRA PEN 40 MG/0.4ML PNKT Inject 40 mg as directed every 14 (fourteen) days.     MAGNESIUM PO Take 660  mg by mouth 2 (two) times daily. Gummy  330 mg each     Multiple Vitamin (MULTIVITAMIN) capsule Take 1 capsule by mouth daily.     pregabalin (LYRICA) 50 MG capsule Take 50 mg by mouth 2 (two) times daily.     progesterone (PROMETRIUM) 200 MG capsule Take 200 mg by mouth at bedtime.     traMADol (ULTRAM) 50 MG tablet Take 50 mg by mouth every 6 (six) hours as needed.     TURMERIC PO Take 650-1,300 mg by mouth See admin instructions. Take 1300 mg in the morning and 650 mg at bedtime     valACYclovir (VALTREX) 1000 MG tablet Take 500 mg by mouth as needed (Cold sores).     Varenicline Tartrate (TYRVAYA) 0.03 MG/ACT SOLN Place 1 spray into the nose 2 (two) times daily.     zolpidem (AMBIEN) 10 MG tablet Take 1  tablet (10 mg total) by mouth at bedtime. 30 tablet 3   hydrOXYzine (ATARAX) 10 MG tablet Take 1 tablet (10 mg total) by mouth 3 (three) times daily as needed for anxiety. (Patient not taking: Reported on 08/25/2023) 30 tablet 0   ondansetron (ZOFRAN-ODT) 4 MG disintegrating tablet Take 1 tablet (4 mg total) by mouth every 8 (eight) hours as needed for nausea or vomiting. (Patient not taking: Reported on 08/25/2023) 20 tablet 0   No current facility-administered medications for this visit.    Allergies:   Codeine, Hydrocodone, and Oxycodone   Social History:  The patient  reports that she has never smoked. She has never used smokeless tobacco. She reports current alcohol use. She reports that she does not use drugs.   Family History:   family history includes Anxiety disorder in her mother; Arthritis in her paternal grandmother; Heart disease in her paternal grandfather; Hypertension in her father; Prostate cancer in her father.    Review of Systems: Review of Systems  Constitutional: Negative.   HENT: Negative.    Respiratory: Negative.    Cardiovascular: Negative.   Gastrointestinal: Negative.   Musculoskeletal: Negative.   Neurological: Negative.   Psychiatric/Behavioral: Negative.    All other systems reviewed and are negative.   PHYSICAL EXAM: VS:  BP 110/70 (BP Location: Left Arm, Patient Position: Sitting, Cuff Size: Normal)   Pulse 61   Ht 5\' 7"  (1.702 m)   Wt 134 lb 4 oz (60.9 kg)   SpO2 98%   BMI 21.03 kg/m  , BMI Body mass index is 21.03 kg/m. Constitutional:  oriented to person, place, and time. No distress.  HENT:  Head: Grossly normal Eyes:  no discharge. No scleral icterus.  Neck: No JVD, no carotid bruits  Cardiovascular: Regular rate and rhythm, no murmurs appreciated Pulmonary/Chest: Clear to auscultation bilaterally, no wheezes or rails Abdominal: Soft.  no distension.  no tenderness.  Musculoskeletal: Normal range of motion Neurological:  normal muscle  tone. Coordination normal. No atrophy Skin: Skin warm and dry Psychiatric: normal affect, pleasant  Recent Labs: 06/29/2023: ALT 15; BUN 12; Creatinine, Ser 0.75; Hemoglobin 13.4; Platelets 218; Potassium 4.2; Sodium 137    Lipid Panel Lab Results  Component Value Date   CHOL 163 11/05/2017   HDL 74.00 11/05/2017   LDLCALC 80 11/05/2017   TRIG 43.0 11/05/2017    Wt Readings from Last 3 Encounters:  08/25/23 134 lb 4 oz (60.9 kg)  07/22/23 133 lb 3.2 oz (60.4 kg)  07/03/23 134 lb 9.6 oz (61.1 kg)     ASSESSMENT AND PLAN:  Problem  List Items Addressed This Visit       Cardiology Problems   Atrial tachycardia (HCC) - Primary   Paroxysmal tachycardia (HCC)   Hypertension   Other Visit Diagnoses     Chest pain of uncertain etiology       Precordial pain       Palpitations       Shortness of breath           Paroxysmal tachycardia Prior work-up with EP at [facility] Attempt at ablation in the past unsuccessful Unable to tolerate flecainide, diltiazem Tachycardia symptoms relatively well-controlled on carvedilol  Chest pain Felt secondary to RA flare Symptoms improved back on Humira Blood pressure better controlled without pain  Anxiety Managed by Dr. Sampson Goon  Rheumatoid arthritis Recent RA flare after holding Humira for several weeks Back on Humira, treated with prednisone taper managed by rheumatology She reports symptoms much better Pain control with tramadol 50 up to 100 mg as needed  Essential hypertension Concerned that blood pressure sometimes running 140 systolic at home Recommend she could increase chlorthalidone up to 25 mg daily continue amlodipine 5 daily Coreg 12.5 twice daily She does report genetic component of her high blood pressure  Signed, Dossie Arbour, M.D., Ph.D. [facility] [place], [place] [phone]

## 2023-08-25 ENCOUNTER — Other Ambulatory Visit: Payer: Self-pay | Admitting: Obstetrics and Gynecology

## 2023-08-25 ENCOUNTER — Ambulatory Visit: Payer: BC Managed Care – PPO | Attending: Cardiovascular Disease | Admitting: Cardiovascular Disease

## 2023-08-25 ENCOUNTER — Encounter: Payer: Self-pay | Admitting: Cardiovascular Disease

## 2023-08-25 VITALS — BP 125/68 | HR 61 | Ht 67.0 in | Wt 134.2 lb

## 2023-08-25 DIAGNOSIS — I4719 Other supraventricular tachycardia: Secondary | ICD-10-CM

## 2023-08-25 DIAGNOSIS — I479 Paroxysmal tachycardia, unspecified: Secondary | ICD-10-CM | POA: Diagnosis not present

## 2023-08-25 DIAGNOSIS — R079 Chest pain, unspecified: Secondary | ICD-10-CM

## 2023-08-25 DIAGNOSIS — R0602 Shortness of breath: Secondary | ICD-10-CM

## 2023-08-25 DIAGNOSIS — I10 Essential (primary) hypertension: Secondary | ICD-10-CM

## 2023-08-25 DIAGNOSIS — R002 Palpitations: Secondary | ICD-10-CM

## 2023-08-25 DIAGNOSIS — R072 Precordial pain: Secondary | ICD-10-CM

## 2023-08-25 MED ORDER — CARVEDILOL 12.5 MG PO TABS: 12.5000 mg | ORAL_TABLET | Freq: Two times a day (BID) | ORAL | 3 refills | Status: AC

## 2023-08-25 MED ORDER — AMLODIPINE BESYLATE 2.5 MG PO TABS: 5.0000 mg | ORAL_TABLET | Freq: Every day | ORAL | 3 refills | Status: DC

## 2023-08-25 MED ORDER — CHLORTHALIDONE 25 MG PO TABS: 25.0000 mg | ORAL_TABLET | Freq: Every day | ORAL | 3 refills | Status: AC

## 2023-08-25 NOTE — Patient Instructions (Signed)
Medication Instructions:  Please increase the chlorthalidone up to 25 mg daily  If you need a refill on your cardiac medications before your next appointment, please call your pharmacy.   Lab work: No new labs needed  Testing/Procedures: No new testing needed  Follow-Up: At [facility], you and your health needs are our priority.  As part of our continuing mission to provide you with exceptional heart care, we have created designated Provider Care Teams.  These Care Teams include your primary Cardiologist (physician) and Advanced Practice Providers (APPs -  Physician Assistants and Nurse Practitioners) who all work together to provide you with the care you need, when you need it.  You will need a follow up appointment in 12 months  Providers on your designated Care Team:   Nicolasa Ducking, NP Eula Listen, PA-C Cadence Fransico [place], [place]  COVID-19 Vaccine Information can be found at: [email or web address] For questions related to vaccine distribution or appointments, please email [email or web address] or call [phone].

## 2023-08-28 ENCOUNTER — Encounter
Admission: RE | Admit: 2023-08-28 | Discharge: 2023-08-28 | Disposition: A | Discharge status: Home / Self Care | Payer: BC Managed Care – PPO | Source: Ambulatory Visit | Attending: Obstetrics and Gynecology | Admitting: Obstetrics and Gynecology

## 2023-08-28 DIAGNOSIS — I10 Essential (primary) hypertension: Secondary | ICD-10-CM

## 2023-08-28 DIAGNOSIS — Z01818 Encounter for other preprocedural examination: Secondary | ICD-10-CM

## 2023-08-28 DIAGNOSIS — Z01812 Encounter for preprocedural laboratory examination: Secondary | ICD-10-CM

## 2023-08-28 HISTORY — DX: Polyp of corpus uteri: N84.0

## 2023-08-28 HISTORY — DX: Gastro-esophageal reflux disease without esophagitis: K21.9

## 2023-08-28 HISTORY — DX: Carpal tunnel syndrome, bilateral upper limbs: G56.03

## 2023-08-28 HISTORY — DX: Raynaud's syndrome without gangrene: I73.00

## 2023-08-28 HISTORY — DX: Personal history of nicotine dependence: Z87.891

## 2023-08-28 HISTORY — DX: Postmenopausal bleeding: N95.0

## 2023-08-28 HISTORY — DX: Fibromyalgia: M79.7

## 2023-08-28 HISTORY — DX: COVID-19: U07.1

## 2023-08-28 HISTORY — DX: Interstitial cystitis (chronic) without hematuria: N30.10

## 2023-08-28 NOTE — H&P (Signed)
Martha [name] is a 52 y.o. female  .   History of Present Illness: Patient presents for a preoperative visit to schedule a D&C, hysteroscopy with myosure polypectomy for PMB and likely endometrial polyp seen on saline.    Saline Ultrasound 07/2023  - Uterus: 6.7 x 3.48 x 3.8 cm  - Endometrium: 5.6 mm - Ovaries: bilateral ovaries wnl Consistent with endometrial polyp, about 3.9 mm    Pertinent Hx: - SVD x 2 - Hx of AUB and heavy bleeding. She had a saline ultrasound in the past and she was told that she had fibroids  - Last pap 06/2022 neg/neg   Past Medical History:  has a past medical history of ADHD, Allergic state, Anxiety, Arrhythmia (08/20/2020), Arthritis, Atrial tachycardia (09/25/2020), Autoimmune disease (CMS/HHS-HCC), Bilateral carpal tunnel syndrome (08/29/2020), COVID-19 (07/01/2021), Depression, Fibroid, GERD (gastroesophageal reflux disease), Heart palpitations (09/11/2020), Hypertension, IC (interstitial cystitis), Interstitial cystitis, Migraine headache, Motion sickness, MRSA (methicillin resistant Staphylococcus aureus) (2006), PAC (premature atrial contraction) (09/11/2020), Preoperative evaluation to rule out surgical contraindication (07/19/2021), RA (rheumatoid arthritis) (CMS/HHS-HCC), and Raynaud's disease.  Past Surgical History:  has a past surgical history that includes Combined augmentation mammaplasty and abdominoplasty (Bilateral, 2019); Knee arthroscopy; Cystoscopy w/ dilation of bladder (N/A); Augmentation mammaplasty (1996); Combined hysteroscopy diagnostic / D&C (2020); polyps removed (2020); breast surgery (2020); heart ablation (09/2020); Breast implants removed (10/2017); arthroscopy knee (Right); arthrodesis anterior cervicle spine (Bilateral, 04/12/2022); arthrodesis anterior cervicle spine (Bilateral, 04/12/2022); insertion interbody biomechanical device w/ instrumentation (Bilateral, 04/12/2022); corpectomy cervicle w/decompression  by anterior approach  (Bilateral, 04/12/2022); instrumentation anterior spine 4 to 7 segments (Bilateral, 04/12/2022); and Cardiac surgery (07/2020). Family History: family history includes Alzheimer's disease in her maternal grandmother and paternal grandmother; Anxiety in her maternal grandmother; Breast cancer in her maternal grandmother; Cancer in her father; Coronary Artery Disease (Blocked arteries around heart) in her paternal grandfather; Deep vein thrombosis (DVT or abnormal blood clot formation) in her maternal grandfather; Dementia in her maternal grandmother; High blood pressure (Hypertension) in her father, maternal grandfather, and mother; Hyperlipidemia (Elevated cholesterol) in her father and maternal grandfather; Migraines in her maternal grandmother; No Known Problems in her brother; Stroke in her paternal grandmother. Social History:  reports that she quit smoking about 26 years ago. Her smoking use included cigarettes. She started smoking about 29 years ago. She has a 0.1 pack-year smoking history. She has never used smokeless tobacco. She reports that she does not currently use alcohol. She reports that she does not use drugs. OB/GYN History:  OB History       Gravida  3   Para  2   Term  2   Preterm      AB  1   Living  2        SAB      IAB      Ectopic      Molar      Multiple      Live Births  2             Allergies: is allergic to codeine and hydrocodone. Medications:  Current Medications    Current Outpatient Medications:    acetaminophen (TYLENOL) 500 MG tablet, Take 1,000 mg by mouth every 8 (eight) hours as needed for Pain, Disp: , Rfl:    adalimumab (HUMIRA) 40 mg/0.4 mL pen injector kit, Inject 40 mg subcutaneously every 14 (fourteen) days, Disp: 1 kit, Rfl: 0   adalimumab-adaz (HYRIMOZ,CF, PEN) 40 mg/0.4 mL PnIj, Inject 40 mg subcutaneously every  14 (fourteen) days, Disp: 2.4 mL, Rfl: 3   amLODIPine (NORVASC) 2.5 MG tablet, Take by mouth, Disp: , Rfl:     amLODIPine (NORVASC) 5 MG tablet, Take by mouth, Disp: , Rfl:    buPROPion (WELLBUTRIN XL) 150 MG XL tablet, Take 1 tablet (150 mg total) by mouth once daily, Disp: 90 tablet, Rfl: 1   busPIRone (BUSPAR) 5 MG tablet, Take 1 tablet (5 mg total) by mouth 2 (two) times daily, Disp: 60 tablet, Rfl: 11   carvediloL (COREG) 6.25 MG tablet, Take 6.25 mg by mouth 2 (two) times daily, Disp: , Rfl:    cetirizine HCl (ZYRTEC ORAL), Take 10 mg by mouth 2 (two) times daily, Disp: , Rfl:    cloNIDine HCL (CATAPRES) 0.1 MG tablet, Take 1 tablet (0.1 mg total) by mouth 2 (two) times daily, Disp: 60 tablet, Rfl: 11   DULoxetine (CYMBALTA) 20 MG DR capsule, , Disp: , Rfl:    escitalopram oxalate (LEXAPRO) 5 MG tablet, Take 5 mg by mouth once daily, Disp: , Rfl:    estradiol (DOTTI) patch 0.1 mg/24 hr, Place 1 patch onto the skin twice a week, Disp: 8 patch, Rfl: 11   hydrOXYzine HCL (ATARAX) 10 MG tablet, Take by mouth, Disp: , Rfl:    MAGNESIUM ORAL, Take 100 mg by mouth 4 (four) times daily 100mg  every morning and 200mg  every evening, Disp: , Rfl:    metoprolol tartrate (LOPRESSOR) 50 MG tablet, TAKE 1 TABLET 60-90 MINUTES PRIOR TO CARDIAC PROCEDURE, Disp: , Rfl:    MULTIVIT,MIN52-FOLIC-[id number]-CQ10 ORAL, Take 1 tablet by mouth once daily, Disp: , Rfl:    pregabalin (LYRICA) 50 MG capsule, Take 1 capsule (50 mg total) by mouth 2 (two) times daily, Disp: 60 capsule, Rfl: 11   progesterone (PROMETRIUM) 200 MG capsule, Take 1 capsule (200 mg total) by mouth once daily, Disp: 30 capsule, Rfl: 11   triamcinolone 0.1 % ointment, Apply topically 2 (two) times daily (Patient not taking: Reported on 07/17/2023), Disp: 30 g, Rfl: 0   TURMERIC ORAL, Take 1 caplet by mouth 3 (three) times a day 2 capsules every morning and 1 capsule every evening, Disp: , Rfl:    upadacitinib (RINVOQ) 15 mg extended release tablet, Take 1 tablet (15 mg total) by mouth once daily, Disp: 90 tablet, Rfl: 1   valACYclovir (VALTREX) 1000 MG tablet,  Take 1,000 mg by mouth 3 (three) times daily as needed, Disp: , Rfl:    varenicline (TYRVAYA) 0.03 mg/spray sprm, Place 2 sprays into both nostrils 2 (two) times daily Dry eye, Disp: , Rfl:    zolpidem (AMBIEN) 10 mg tablet, Take 1 tablet (10 mg total) by mouth at bedtime, Disp: 90 tablet, Rfl: 0   zolpidem (AMBIEN) 10 mg tablet, Take 1 tablet (10 mg total) by mouth at bedtime, Disp: 90 tablet, Rfl: 0     Review of Systems: No SOB, no palpitations or chest pain, no new lower extremity edema, no nausea or vomiting or bowel or bladder complaints. See HPI for gyn specific ROS.    Exam:         Constitutional:  General appearance: Well nourished, well developed female in no acute distress.  CV: RRR Pulm: CTAB Neuro/psych:  Normal mood and affect. No gross motor deficits. Neck:  Supple, normal appearance.  Respiratory:  Normal respiratory effort, no use of accessory muscles Skin:  No visible rashes or external lesions      Impression:    There were no encounter diagnoses.  Plan:    1.  Preoperative visit: D&C hysteroscopy Consents signed on day of surgery .  -Risks of surgery were discussed with the patient including but not limited to: bleeding which may require transfusion; infection which may require antibiotics; injury to uterus or surrounding organs; intrauterine scarring which may impair future fertility; need for additional procedures including laparotomy or laparoscopy; and other postoperative/anesthesia complications. Written informed consent was obtained.   This is a scheduled same-day surgery. She will have a postop visit in 2 weeks to review operative findings and pathology.

## 2023-08-28 NOTE — Patient Instructions (Signed)
Your procedure is scheduled on:09-05-23 Friday Report to the Registration Desk on the 1st floor of the [facility].Then proceed to the 2nd floor Surgery Desk To find out your arrival time, please call [phone] between 1PM - 3PM on:09-04-23 Thursday If your arrival time is 6:00 am, do not arrive before that time as the [facility] entrance doors do not open until 6:00 am.  REMEMBER: Instructions that are not followed completely may result in serious medical risk, up to and including death; or upon the discretion of your surgeon and anesthesiologist your surgery may need to be rescheduled.  Do not eat food after midnight the night before surgery.  No gum chewing or hard candies.  You may however, drink CLEAR liquids up to 2 hours before you are scheduled to arrive for your surgery. Do not drink anything within 2 hours of your scheduled arrival time.  Clear liquids include: - water  - apple juice without pulp - gatorade (not RED colors) - black coffee or tea (Do NOT add milk or creamers to the coffee or tea) Do NOT drink anything that is not on this list.  One week prior to surgery:Stop NOW (08-28-23) Stop Anti-inflammatories (NSAIDS) such as Advil, Aleve, Ibuprofen, Motrin, Naproxen, Naprosyn and Aspirin based products such as Excedrin, Goody's Powder, BC Powder. Stop ANY OVER THE COUNTER supplements until after surgery (Magnesium, Multivitamin, Turmeric)  You may however, continue to take Tylenol/Tramadol if needed for pain up until the day of surgery.  Continue taking all of your other prescription medications up until the day of surgery.  ON THE DAY OF SURGERY ONLY TAKE THESE MEDICATIONS WITH SIPS OF WATER: -buPROPion (WELLBUTRIN XL)  -carvedilol (COREG)  -cetirizine (ZYRTEC)  -escitalopram (LEXAPRO)  -pregabalin (LYRICA)   No Alcohol for 24 hours before or after surgery.  No Smoking including e-cigarettes for 24 hours before surgery.  No chewable tobacco products for at  least 6 hours before surgery.  No nicotine patches on the day of surgery.  Do not use any "recreational" drugs for at least a week (preferably 2 weeks) before your surgery.  Please be advised that the combination of cocaine and anesthesia may have negative outcomes, up to and including death. If you test positive for cocaine, your surgery will be cancelled.  On the morning of surgery brush your teeth with toothpaste and water, you may rinse your mouth with mouthwash if you wish. Do not swallow any toothpaste or mouthwash.  Do not wear jewelry, make-up, hairpins, clips or nail polish.  For welded (permanent) jewelry: bracelets, anklets, waist bands, etc.  Please have this removed prior to surgery.  If it is not removed, there is a chance that hospital personnel will need to cut it off on the day of surgery.  Do not wear lotions, powders, or perfumes.   Do not shave body hair from the neck down 48 hours before surgery.  Contact lenses, hearing aids and dentures may not be worn into surgery.  Do not bring valuables to the hospital. [facility] is not responsible for any missing/lost belongings or valuables.   Notify your doctor if there is any change in your medical condition (cold, fever, infection).  Wear comfortable clothing (specific to your surgery type) to the hospital.  After surgery, you can help prevent lung complications by doing breathing exercises.  Take deep breaths and cough every 1-2 hours. Your doctor may order a device called an Incentive Spirometer to help you take deep breaths. When coughing or sneezing,  hold a pillow firmly against your incision with both hands. This is called "splinting." Doing this helps protect your incision. It also decreases belly discomfort.  If you are being admitted to the hospital overnight, leave your suitcase in the car. After surgery it may be brought to your room.  In case of increased patient census, it may be necessary for you, the  patient, to continue your postoperative care in the Same Day Surgery department.  If you are being discharged the day of surgery, you will not be allowed to drive home. You will need a responsible individual to drive you home and stay with you for 24 hours after surgery.   If you are taking public transportation, you will need to have a responsible individual with you.  Please call the Pre-admissions Testing Dept. at [phone] if you have any questions about these instructions.  Surgery Visitation Policy:  Patients having surgery or a procedure may have two visitors.  Children under the age of 54 must have an adult with them who is not the patient.  How to Use an Incentive Spirometer An incentive spirometer is a tool that measures how well you are filling your lungs with each breath. Learning to take long, deep breaths using this tool can help you keep your lungs clear and active. This may help to reverse or lessen your chance of developing breathing (pulmonary) problems, especially infection. You may be asked to use a spirometer: After a surgery. If you have a lung problem or a history of smoking. After a long period of time when you have been unable to move or be active. If the spirometer includes an indicator to show the highest number that you have reached, your health care provider or respiratory therapist will help you set a goal. Keep a log of your progress as told by your health care provider. What are the risks? Breathing too quickly may cause dizziness or cause you to pass out. Take your time so you do not get dizzy or light-headed. If you are in pain, you may need to take pain medicine before doing incentive spirometry. It is harder to take a deep breath if you are having pain. How to use your incentive spirometer  Sit up on the edge of your bed or on a chair. Hold the incentive spirometer so that it is in an upright position. Before you use the spirometer, breathe out  normally. Place the mouthpiece in your mouth. Make sure your lips are closed tightly around it. Breathe in slowly and as deeply as you can through your mouth, causing the piston or the ball to rise toward the top of the chamber. Hold your breath for 3-5 seconds, or for as long as possible. If the spirometer includes a coach indicator, use this to guide you in breathing. Slow down your breathing if the indicator goes above the marked areas. Remove the mouthpiece from your mouth and breathe out normally. The piston or ball will return to the bottom of the chamber. Rest for a few seconds, then repeat the steps 10 or more times. Take your time and take a few normal breaths between deep breaths so that you do not get dizzy or light-headed. Do this every 1-2 hours when you are awake. If the spirometer includes a goal marker to show the highest number you have reached (best effort), use this as a goal to work toward during each repetition. After each set of 10 deep breaths, cough a few times.  This will help to make sure that your lungs are clear. If you have an incision on your chest or abdomen from surgery, place a pillow or a rolled-up towel firmly against the incision when you cough. This can help to reduce pain while taking deep breaths and coughing. General tips When you are able to get out of bed: Walk around often. Continue to take deep breaths and cough in order to clear your lungs. Keep using the incentive spirometer until your health care provider says it is okay to stop using it. If you have been in the hospital, you may be told to keep using the spirometer at home. Contact a health care provider if: You are having difficulty using the spirometer. You have trouble using the spirometer as often as instructed. Your pain medicine is not giving enough relief for you to use the spirometer as told. You have a fever. Get help right away if: You develop shortness of breath. You develop a cough  with bloody mucus from the lungs. You have fluid or blood coming from an incision site after you cough. Summary An incentive spirometer is a tool that can help you learn to take long, deep breaths to keep your lungs clear and active. You may be asked to use a spirometer after a surgery, if you have a lung problem or a history of smoking, or if you have been inactive for a long period of time. Use your incentive spirometer as instructed every 1-2 hours while you are awake. If you have an incision on your chest or abdomen, place a pillow or a rolled-up towel firmly against your incision when you cough. This will help to reduce pain. Get help right away if you have shortness of breath, you cough up bloody mucus, or blood comes from your incision when you cough. This information is not intended to replace advice given to you by your health care provider. Make sure you discuss any questions you have with your health care provider. Document Revised: 01/03/2020 Document Reviewed: 01/03/2020 Elsevier Patient Education  2024 [employer].

## 2023-08-29 ENCOUNTER — Encounter
Admission: RE | Admit: 2023-08-29 | Discharge: 2023-08-29 | Disposition: A | Discharge status: Home / Self Care | Payer: BC Managed Care – PPO | Source: Ambulatory Visit | Attending: Obstetrics and Gynecology | Admitting: Obstetrics and Gynecology

## 2023-08-29 DIAGNOSIS — N950 Postmenopausal bleeding: Secondary | ICD-10-CM | POA: Diagnosis not present

## 2023-08-29 DIAGNOSIS — Z01812 Encounter for preprocedural laboratory examination: Secondary | ICD-10-CM | POA: Diagnosis present

## 2023-08-29 DIAGNOSIS — I10 Essential (primary) hypertension: Secondary | ICD-10-CM | POA: Diagnosis not present

## 2023-08-29 LAB — BASIC METABOLIC PANEL
Anion gap: 7 (ref 5–15)
BUN: 13 mg/dL (ref 6–20)
CO2: 29 mmol/L (ref 22–32)
Calcium: 8.5 mg/dL — ABNORMAL LOW (ref 8.9–10.3)
Chloride: 100 mmol/L (ref 98–111)
Creatinine, Ser: 0.72 mg/dL (ref 0.44–1.00)
GFR, Estimated: >60 mL/min (ref >60)
Glucose, Bld: 96 mg/dL (ref 70–99)
Potassium: 3.3 mmol/L — ABNORMAL LOW (ref 3.5–5.1)
Sodium: 136 mmol/L (ref 135–145)

## 2023-08-29 LAB — CBC
HCT: 35.9 % — ABNORMAL LOW (ref 36.0–46.0)
Hemoglobin: 12.4 g/dL (ref 12.0–15.0)
MCH: 31.2 pg (ref 26.0–34.0)
MCHC: 34.5 g/dL (ref 30.0–36.0)
MCV: 90.2 fL (ref 80.0–100.0)
Platelets: 199 10*3/uL (ref 150–400)
RBC: 3.98 MIL/uL (ref 3.87–5.11)
RDW: 11.9 % (ref 11.5–15.5)
WBC: 5.2 10*3/uL (ref 4.0–10.5)
nRBC: 0 % (ref 0.0–0.2)

## 2023-09-04 MED ORDER — CHLORHEXIDINE GLUCONATE 0.12 % MT SOLN
15.0000 mL | Freq: Once | OROMUCOSAL | Status: AC
  Administered 2023-09-05: 15 mL via OROMUCOSAL

## 2023-09-04 MED ORDER — LACTATED RINGERS IV SOLN: INTRAVENOUS | Status: DC

## 2023-09-04 MED ORDER — LACTATED RINGERS IV SOLN: INTRAVENOUS | Status: AC

## 2023-09-04 MED ORDER — POVIDONE-IODINE 10 % EX SWAB: 2.0000 | Freq: Once | CUTANEOUS | Status: DC

## 2023-09-04 MED ORDER — ORAL CARE MOUTH RINSE: 15.0000 mL | Freq: Once | OROMUCOSAL | Status: AC

## 2023-09-05 ENCOUNTER — Other Ambulatory Visit: Payer: Self-pay

## 2023-09-05 ENCOUNTER — Encounter
Admission: RE | Disposition: A | Discharge status: Home / Self Care | Payer: Self-pay | Source: Home / Self Care | Attending: Obstetrics and Gynecology

## 2023-09-05 ENCOUNTER — Ambulatory Visit
Admission: RE | Admit: 2023-09-05 | Discharge: 2023-09-05 | Disposition: A | Discharge status: Home / Self Care | Payer: BC Managed Care – PPO | Attending: Obstetrics and Gynecology | Admitting: Obstetrics and Gynecology

## 2023-09-05 ENCOUNTER — Encounter: Payer: Self-pay | Admitting: Obstetrics and Gynecology

## 2023-09-05 ENCOUNTER — Ambulatory Visit: Payer: BC Managed Care – PPO | Admitting: Certified Registered"

## 2023-09-05 ENCOUNTER — Ambulatory Visit: Payer: BC Managed Care – PPO | Admitting: Urgent Care

## 2023-09-05 DIAGNOSIS — F419 Anxiety disorder, unspecified: Secondary | ICD-10-CM | POA: Diagnosis not present

## 2023-09-05 DIAGNOSIS — Q5128 Other doubling of uterus, other specified: Secondary | ICD-10-CM | POA: Diagnosis not present

## 2023-09-05 DIAGNOSIS — F909 Attention-deficit hyperactivity disorder, unspecified type: Secondary | ICD-10-CM | POA: Insufficient documentation

## 2023-09-05 DIAGNOSIS — Z01818 Encounter for other preprocedural examination: Secondary | ICD-10-CM

## 2023-09-05 DIAGNOSIS — N950 Postmenopausal bleeding: Secondary | ICD-10-CM | POA: Insufficient documentation

## 2023-09-05 DIAGNOSIS — M797 Fibromyalgia: Secondary | ICD-10-CM | POA: Diagnosis not present

## 2023-09-05 DIAGNOSIS — Z8616 Personal history of COVID-19: Secondary | ICD-10-CM | POA: Insufficient documentation

## 2023-09-05 DIAGNOSIS — F32A Depression, unspecified: Secondary | ICD-10-CM | POA: Diagnosis not present

## 2023-09-05 DIAGNOSIS — N879 Dysplasia of cervix uteri, unspecified: Secondary | ICD-10-CM | POA: Diagnosis not present

## 2023-09-05 DIAGNOSIS — Z87891 Personal history of nicotine dependence: Secondary | ICD-10-CM | POA: Insufficient documentation

## 2023-09-05 DIAGNOSIS — I10 Essential (primary) hypertension: Secondary | ICD-10-CM | POA: Diagnosis not present

## 2023-09-05 DIAGNOSIS — M069 Rheumatoid arthritis, unspecified: Secondary | ICD-10-CM | POA: Diagnosis not present

## 2023-09-05 HISTORY — PX: HYSTEROSCOPY WITH D & C: SHX1775

## 2023-09-05 LAB — POCT PREGNANCY, URINE: Preg Test, Ur: NEGATIVE

## 2023-09-05 SURGERY — DILATATION AND CURETTAGE /HYSTEROSCOPY
Anesthesia: General | Site: Uterus

## 2023-09-05 MED ORDER — GLYCOPYRROLATE 0.2 MG/ML IJ SOLN
INTRAMUSCULAR | Status: DC | PRN
  Administered 2023-09-05: .2 mg via INTRAVENOUS

## 2023-09-05 MED ORDER — FENTANYL CITRATE (PF) 100 MCG/2ML IJ SOLN
INTRAMUSCULAR | Status: DC | PRN
  Administered 2023-09-05: 25 ug via INTRAVENOUS

## 2023-09-05 MED ORDER — FENTANYL CITRATE (PF) 100 MCG/2ML IJ SOLN
INTRAMUSCULAR | Status: AC
  Filled 2023-09-05: qty 2

## 2023-09-05 MED ORDER — MIDAZOLAM HCL 2 MG/2ML IJ SOLN
INTRAMUSCULAR | Status: AC
  Filled 2023-09-05: qty 2

## 2023-09-05 MED ORDER — PROPOFOL 10 MG/ML IV BOLUS
INTRAVENOUS | Status: DC | PRN
  Administered 2023-09-05: 170 mg via INTRAVENOUS

## 2023-09-05 MED ORDER — 0.9 % SODIUM CHLORIDE (POUR BTL) OPTIME
TOPICAL | Status: DC | PRN
  Administered 2023-09-05: 500 mL

## 2023-09-05 MED ORDER — DROPERIDOL 2.5 MG/ML IJ SOLN: 0.6250 mg | Freq: Once | INTRAMUSCULAR | Status: DC | PRN

## 2023-09-05 MED ORDER — MIDAZOLAM HCL 2 MG/2ML IJ SOLN
INTRAMUSCULAR | Status: DC | PRN
  Administered 2023-09-05: 2 mg via INTRAVENOUS

## 2023-09-05 MED ORDER — EPHEDRINE SULFATE-NACL 50-0.9 MG/10ML-% IV SOSY
PREFILLED_SYRINGE | INTRAVENOUS | Status: DC | PRN
  Administered 2023-09-05 (×3): 5 mg via INTRAVENOUS

## 2023-09-05 MED ORDER — DEXAMETHASONE SODIUM PHOSPHATE 10 MG/ML IJ SOLN
INTRAMUSCULAR | Status: DC | PRN
  Administered 2023-09-05: 10 mg via INTRAVENOUS

## 2023-09-05 MED ORDER — SODIUM CHLORIDE 0.9 % IR SOLN
Status: DC | PRN
Start: 2023-09-05 — End: 2023-09-05
  Administered 2023-09-05: 1425 mL

## 2023-09-05 MED ORDER — PROPOFOL 10 MG/ML IV BOLUS
INTRAVENOUS | Status: AC
  Filled 2023-09-05: qty 20

## 2023-09-05 MED ORDER — ACETAMINOPHEN 10 MG/ML IV SOLN: 1000.0000 mg | Freq: Once | INTRAVENOUS | Status: DC | PRN

## 2023-09-05 MED ORDER — OXYCODONE HCL 5 MG PO TABS
ORAL_TABLET | ORAL | Status: AC
  Filled 2023-09-05: qty 1

## 2023-09-05 MED ORDER — ONDANSETRON HCL 4 MG/2ML IJ SOLN
INTRAMUSCULAR | Status: DC | PRN
  Administered 2023-09-05: 4 mg via INTRAVENOUS

## 2023-09-05 MED ORDER — OXYCODONE HCL 5 MG PO TABS
5.0000 mg | ORAL_TABLET | Freq: Once | ORAL | Status: AC | PRN
  Administered 2023-09-05: 5 mg via ORAL

## 2023-09-05 MED ORDER — CHLORHEXIDINE GLUCONATE 0.12 % MT SOLN
OROMUCOSAL | Status: AC
  Filled 2023-09-05: qty 15

## 2023-09-05 MED ORDER — FENTANYL CITRATE (PF) 100 MCG/2ML IJ SOLN
25.0000 ug | INTRAMUSCULAR | Status: DC | PRN
  Administered 2023-09-05 (×2): 25 ug via INTRAVENOUS

## 2023-09-05 MED ORDER — OXYCODONE HCL 5 MG/5ML PO SOLN: 5.0000 mg | Freq: Once | ORAL | Status: AC | PRN

## 2023-09-05 SURGICAL SUPPLY — 24 items
BAG PRESSURE INF REUSE 1000 (BAG) ×2 IMPLANT
BASIN KIT SINGLE STR (MISCELLANEOUS) ×2 IMPLANT
DEVICE MYOSURE LITE (MISCELLANEOUS) IMPLANT
DEVICE MYOSURE REACH (MISCELLANEOUS) IMPLANT
DRAPE LEGGINS SURG 28X43 STRL (DRAPES) IMPLANT
DRSG TELFA 3X8 NADH STRL (GAUZE/BANDAGES/DRESSINGS) IMPLANT
ELECT REM PT RETURN 9FT ADLT (ELECTROSURGICAL) ×1
ELECTRODE REM PT RTRN 9FT ADLT (ELECTROSURGICAL) ×2 IMPLANT
GLOVE BIO SURGEON STRL SZ7 (GLOVE) ×2 IMPLANT
GLOVE INDICATOR 7.5 STRL GRN (GLOVE) ×2 IMPLANT
GOWN STRL REUS W/ TWL LRG LVL3 (GOWN DISPOSABLE) ×4 IMPLANT
GOWN STRL REUS W/TWL LRG LVL3 (GOWN DISPOSABLE) ×2
IV NS IRRIG 3000ML ARTHROMATIC (IV SOLUTION) ×2 IMPLANT
KIT PROCEDURE FLUENT (KITS) ×2 IMPLANT
KIT TURNOVER CYSTO (KITS) ×2 IMPLANT
MANIFOLD NEPTUNE II (INSTRUMENTS) ×2 IMPLANT
PACK DNC HYST (MISCELLANEOUS) ×2 IMPLANT
PAD PREP OB/GYN DISP 24X41 (PERSONAL CARE ITEMS) ×2 IMPLANT
SCRUB CHG 4% DYNA-HEX 4OZ (MISCELLANEOUS) ×2 IMPLANT
SEAL ROD LENS SCOPE MYOSURE (ABLATOR) IMPLANT
SET CYSTO W/LG BORE CLAMP LF (SET/KITS/TRAYS/PACK) IMPLANT
TRAP FLUID SMOKE EVACUATOR (MISCELLANEOUS) ×2 IMPLANT
TUBING CONNECTING 10 (TUBING) ×2 IMPLANT
WATER STERILE IRR 500ML POUR (IV SOLUTION) ×2 IMPLANT

## 2023-09-05 NOTE — Anesthesia Preprocedure Evaluation (Addendum)
Anesthesia Evaluation  Patient identified by MRN, date of birth, ID band Patient awake    Reviewed: Allergy & Precautions, H&P , NPO status , Patient's Chart, lab work & pertinent test results  Airway Mallampati: II  TM Distance: >3 FB Neck ROM: full    Dental no notable dental hx.    Pulmonary former smoker   Pulmonary exam normal        Cardiovascular hypertension, Normal cardiovascular exam+ dysrhythmias (h/o Atrial tachycardia)   Paroxysmal tachycardia Prior work-up with EP at [facility] Attempt at ablation in the past unsuccessful Unable to tolerate flecainide, diltiazem Tachycardia symptoms relatively well-controlled on carvedilol   ECHO 9/24:  1. Left ventricular ejection fraction, by estimation, is 60 to 65%. The  left ventricle has normal function. The left ventricle has no regional  wall motion abnormalities. Left ventricular diastolic parameters were  normal. The average left ventricular  global longitudinal strain is -19.3 %. The global longitudinal strain is  normal.   2. Right ventricular systolic function is normal. The right ventricular  size is normal. There is normal pulmonary artery systolic pressure. The  estimated right ventricular systolic pressure is 24.3 mmHg.   3. Left atrial size was mildly dilated.   4. The mitral valve is abnormal wtih mild bileaflet prolapse. Mild mitral  valve regurgitation. No evidence of mitral stenosis.   5. The aortic valve is tricuspid. Aortic valve regurgitation is not  visualized. No aortic stenosis is present.   6. The inferior vena cava is normal in size with greater than 50%  respiratory variability, suggesting right atrial pressure of 3 mmHg.     Neuro/Psych negative neurological ROS  negative psych ROS   GI/Hepatic Neg liver ROS,GERD  ,,  Endo/Other  negative endocrine ROS    Renal/GU      Musculoskeletal  (+) Arthritis , Rheumatoid disorders,  Fibromyalgia  -Raynaud disease   Abdominal   Peds  Hematology negative hematology ROS (+)   Anesthesia Other Findings postmenopausal bleeding  Past Medical History: No date: ADHD (attention deficit hyperactivity disorder) No date: Allergy No date: Anxiety No date: Atrial tachycardia (HCC) No date: Carpal tunnel syndrome, bilateral No date: COVID-19 No date: Depression No date: Endometrial polyp No date: Fibromyalgia No date: Former smoker     Comment:  college years only (2 years) No date: GERD (gastroesophageal reflux disease) 2006: History of methicillin resistant staphylococcus aureus (MRSA) No date: Hypertension No date: IC (interstitial cystitis) No date: Insomnia No date: Interstitial cystitis No date: Migraine headache No date: PMB (postmenopausal bleeding) No date: Raynaud disease No date: Rheumatoid arthritis (HCC)  Past Surgical History: No date: AUGMENTATION MAMMAPLASTY; Bilateral     Comment:  implants removed 1995, 2016: BREAST SURGERY 09/2020: CARDIAC ELECTROPHYSIOLOGY STUDY AND ABLATION     Comment:  failed No date: COSMETIC SURGERY     Comment:  Breast implants x 2. Remove breast implants removed No date: CYSTOSCOPY     Comment:  with antibiotic wash x3 11/05/2018: D&C     Comment:  Polyps removed - pathology okay 2019: Explant capsulectomy     Comment:  implants removed 2017: MEDIAL COLLATERAL LIGAMENT REPAIR, KNEE; Right     Comment:  x3 No date: SPINE SURGERY     Comment:  Cervial corpectomy and fusion  BMI    Body Mass Index: 20.99 kg/m      Reproductive/Obstetrics negative OB ROS  Anesthesia Physical Anesthesia Plan  ASA: 2  Anesthesia Plan: General LMA   Post-op Pain Management: Ofirmev IV (intra-op)* and Toradol IV (intra-op)*   Induction: Intravenous  PONV Risk Score and Plan: 3 and Dexamethasone, Ondansetron and Midazolam  Airway Management Planned: LMA  Additional Equipment:    Intra-op Plan:   Post-operative Plan: Extubation in OR  Informed Consent: I have reviewed the patients History and Physical, chart, labs and discussed the procedure including the risks, benefits and alternatives for the proposed anesthesia with the patient or authorized representative who has indicated his/her understanding and acceptance.     Dental Advisory Given  Plan Discussed with: Anesthesiologist, CRNA and Surgeon  Anesthesia Plan Comments:          Anesthesia Quick Evaluation

## 2023-09-05 NOTE — Discharge Instructions (Signed)
Discharge instructions after a hysteroscopy with dilation and curettage ? ?Signs and Symptoms to Report ? ?Call our office at [phone] if you have any of the following:  ? ? Fever over 100.4 degrees or higher ? Severe stomach pain not relieved with pain medications ? Bright red bleeding that?s heavier than a period that does not slow with rest after the first 24 hours ? To go the bathroom a lot (frequency), you can?t hold your urine (urgency), or it hurts when you empty your bladder (urinate) ? Chest pain ? Shortness of breath ? Pain in the calves of your legs ? Severe nausea and vomiting not relieved with anti-nausea medications ? Any concerns ? ?What You Can Expect after Surgery ? You may see some pink tinged, bloody fluid. This is normal. You may also have cramping for several days.  ? ?Activities after Your Discharge ?Follow these guidelines to help speed your recovery at home: ? Don?t drive if you are in pain or taking narcotic pain medicine. You may drive when you can safely slam on the brakes, turn the wheel forcefully, and rotate your torso comfortably. This is typically 4-7 days. Practice in a parking lot or side street prior to attempting to drive regularly.  ? Ask others to help with household chores for 4 weeks. ? Don?t do strenuous activities, exercises, or sports like vacuuming, tennis, squash, etc. until your doctor says it is safe to do so. ? Walk as you feel able. Rest often since it may take a week or two for your energy level to return to normal.  ? You may climb stairs ? Avoid constipation: ?  -Eat fruits, vegetables, and whole grains. Eat small meals as your appetite will take time to return to normal. ?  -Drink 6 to 8 glasses of water each day unless your doctor has told you to limit your fluids. ?  -Use a laxative or stool softener as needed if constipation becomes a problem. You may take Miralax, metamucil, Citrucil, Colace, Senekot, FiberCon, etc. If this does not relieve the  constipation, try two tablespoons of Milk Of Magnesia every 8 hours until your bowels move.  ? You may shower.  ? Do not get in a hot tub, swimming pool, etc. until your doctor agrees. ? Do not douche, use tampons, or have sex until your doctor says it is okay, usually about 2 weeks. ? Take your pain medicine when you need it. The medicine may not work as well if the pain is bad. ? ?Take the medicines you were taking before surgery. Other medications you might need are pain medications (ibuprofen), medications for constipation (Colace) and nausea medications (Zofran).  ? ?  ?

## 2023-09-05 NOTE — Anesthesia Procedure Notes (Signed)
Procedure Name: LMA Insertion Date/Time: 09/05/2023 9:50 AM  Performed by: Lanell Matar, CRNAPre-anesthesia Checklist: Patient identified, Emergency Drugs available, Suction available and Patient being monitored Patient Re-evaluated:Patient Re-evaluated prior to induction Oxygen Delivery Method: Circle System Utilized Preoxygenation: Pre-oxygenation with 100% oxygen Induction Type: IV induction Ventilation: Mask ventilation without difficulty LMA: LMA inserted LMA Size: 4.0 Number of attempts: 1 Airway Equipment and Method: Bite block Placement Confirmation: positive ETCO2 Tube secured with: Tape Dental Injury: Teeth and Oropharynx as per pre-operative assessment

## 2023-09-05 NOTE — Interval H&P Note (Signed)
History and Physical Interval Note:  09/05/2023 8:52 AM  Martha [name]  has presented today for surgery, with the diagnosis of postmenopausal bleeding.  The various methods of treatment have been discussed with the patient and family. After consideration of risks, benefits and other options for treatment, the patient has consented to  Procedure(s): DILATATION AND CURETTAGE /HYSTEROSCOPY (N/A) as a surgical intervention.  The patient's history has been reviewed, patient examined, no change in status, stable for surgery.  I have reviewed the patient's chart and labs.  Questions were answered to the patient's satisfaction.     Christeen Douglas

## 2023-09-05 NOTE — Transfer of Care (Signed)
Immediate Anesthesia Transfer of Care Note  Patient: Martha [name]  Procedure(s) Performed: DILATATION AND CURETTAGE /HYSTEROSCOPY (Uterus)  Patient Location: PACU  Anesthesia Type:General  Level of Consciousness: drowsy and patient cooperative  Airway & Oxygen Therapy: Patient Spontanous Breathing and Patient connected to face mask oxygen  Post-op Assessment: Report given to RN, Post -op Vital signs reviewed and stable, and Patient moving all extremities X 4  Post vital signs: Reviewed and stable  Last Vitals:  Vitals Value Taken Time  BP 129/59 09/05/23 1031  Temp 35.9 C 09/05/23 1030  Pulse 71 09/05/23 1034  Resp 13 09/05/23 1034  SpO2 100 % 09/05/23 1034  Vitals shown include unfiled device data.  Last Pain:  Vitals:   09/05/23 1030  TempSrc:   PainSc: 0-No pain         Complications: No notable events documented.

## 2023-09-05 NOTE — Op Note (Signed)
Operative Report Hysteroscopy with Dilation and Curettage   Indications: Postmenopausal bleeding   Pre-operative Diagnosis: Endometrial polyp   Post-operative Diagnosis: uterine septum.  Procedure: 1. Exam under anesthesia 2. Fractional D&C 3. Hysteroscopy 4. Myosure polypectomy  Surgeon: Christeen Douglas, MD  Assistant(s):  None  Anesthesia: General LMA anesthesia  Anesthesiologist: Foye Deer, MD Anesthesiologist: Foye Deer, MD CRNA: Lanell Matar, CRNA  Estimated Blood Loss:  Minimal  Total IV Fluids:  Urine Output: 70ml  Total Fluid Deficit:  575 mL          Specimens: Endocervical curettings, endometrial curettings, Myosure polyp         Complications:  None; patient tolerated the procedure well.         Disposition: PACU - hemodynamically stable.         Condition: stable  Findings: Uterus measuring 9.5 cm by sound; normal cervix, vagina, perineum. Uterine septum and possible myometrial perforation in left fundus, though no fluid lost through this area and it was visualized throughout the procedure. Possible endometrial polyp to the right of the septum  Indication for procedure/Consents: 52 y.o. [name] here for scheduled surgery for the aforementioned diagnoses.    Risks of surgery were discussed with the patient including but not limited to: bleeding which may require transfusion; infection which may require antibiotics; injury to uterus or surrounding organs; intrauterine scarring which may impair future fertility; need for additional procedures including laparotomy or laparoscopy; and other postoperative/anesthesia complications. Written informed consent was obtained.    Procedure Details:   D&C/ Myosure  The patient was taken to the operating room where anesthesia was administered and was found to be adequate.  After a formal and adequate timeout was performed, she was placed in the dorsal lithotomy position and examined with the  above findings. She was then prepped and draped in the sterile manner.   Her bladder was catheterized for an estimated amount of clear, yellow urine. A weighed speculum was then placed in the patient's vagina and a single tooth tenaculum was applied to the anterior lip of the cervix.  An endocervical curette was performed. Her cervix was serially dilated to 15 [place] using Hanks dilators. The hysteroscope was introduced under direct observation  Using lactated ringers as a distention medium to reveal the above findings. The uterine cavity was carefully examined, both ostia were recognized, and diffusely atrophic endometrium with the polyp above were noted.   This was resected using the Myosure device.  After further careful visualization of the uterine cavity, the hysteroscope was removed under direct visualization.  A sharp curettage was then performed until there was a gritty texture in all four quadrants.  The tenaculum was removed from the anterior lip of the cervix and the vaginal speculum was removed after applying silver nitrate/pressure for good hemostasis.   The patient tolerated the procedure well and was taken to the recovery area awake and in stable condition. She received iv acetaminophen and Toradol prior to leaving the OR.  The patient will be discharged to home as per PACU criteria. Routine postoperative instructions given.  She was prescribed Ibuprofen and Colace.  She will follow up in the clinic in two weeks for postoperative evaluation.

## 2023-09-08 ENCOUNTER — Other Ambulatory Visit: Payer: Self-pay | Admitting: Family Medicine

## 2023-09-08 ENCOUNTER — Other Ambulatory Visit: Payer: Self-pay | Admitting: *Deleted

## 2023-09-08 ENCOUNTER — Other Ambulatory Visit: Payer: Self-pay

## 2023-09-08 DIAGNOSIS — F411 Generalized anxiety disorder: Secondary | ICD-10-CM

## 2023-09-08 LAB — SURGICAL PATHOLOGY

## 2023-09-08 NOTE — Telephone Encounter (Signed)
Called pt, LVMTCB to schedule FU for GAD and depression for medication refill.

## 2023-09-08 NOTE — Telephone Encounter (Signed)
Requested Prescriptions  Pending Prescriptions Disp Refills   escitalopram (LEXAPRO) 5 MG tablet 30 tablet 0    Sig: Take 1 tablet (5 mg total) by mouth daily.     Psychiatry:  Antidepressants - SSRI Passed - 09/08/2023  5:40 PM      Passed - Valid encounter within last 6 months    Recent Outpatient Visits           2 months ago GAD (generalized anxiety disorder)   [place] [facility] [place], Marzella Schlein, MD   2 months ago Rheumatoid arthritis involving multiple sites with positive rheumatoid factor [facility])   [place] [facility] [place], Marzella Schlein, MD   3 years ago Burning with urination   [place] [facility] Valentino Nose, NP   3 years ago Primary insomnia   [place] [facility] [place], [place], DO   4 years ago Acute non-recurrent maxillary sinusitis   [place] [facility] Gabriel Cirri, NP       Future Appointments             In 3 weeks [place], Marzella Schlein, MD [facility]

## 2023-09-08 NOTE — Anesthesia Postprocedure Evaluation (Signed)
Anesthesia Post Note  Patient: Martha [name]  Procedure(s) Performed: DILATATION AND CURETTAGE /HYSTEROSCOPY (Uterus)  Patient location during evaluation: PACU Anesthesia Type: General Level of consciousness: awake and alert Pain management: pain level controlled Vital Signs Assessment: post-procedure vital signs reviewed and stable Respiratory status: spontaneous breathing, nonlabored ventilation and respiratory function stable Cardiovascular status: blood pressure returned to baseline and stable Postop Assessment: no apparent nausea or vomiting Anesthetic complications: no   No notable events documented.   Last Vitals:  Vitals:   09/05/23 1100 09/05/23 1107  BP:    Pulse: 79 77  Resp: 13 15  Temp:  (!) 36.1 C  SpO2: 100% 100%    Last Pain:  Vitals:   09/05/23 1100  TempSrc:   PainSc: 2                  Foye Deer

## 2023-09-08 NOTE — Telephone Encounter (Signed)
Requested Prescriptions  Pending Prescriptions Disp Refills   escitalopram (LEXAPRO) 5 MG tablet [Pharmacy Med Name: ESCITALOPRAM 5 MG TABLET] 30 tablet 0    Sig: Take 1 tablet (5 mg total) by mouth daily.     Psychiatry:  Antidepressants - SSRI Passed - 09/08/2023  8:45 AM      Passed - Valid encounter within last 6 months    Recent Outpatient Visits           2 months ago GAD (generalized anxiety disorder)   [facility] [place], Marzella Schlein, MD   2 months ago Rheumatoid arthritis involving multiple sites with positive rheumatoid factor [facility])   [facility] [place], Marzella Schlein, MD   3 years ago Burning with urination   [place] [facility] Valentino Nose, NP   3 years ago Primary insomnia   [place] [facility] [place], [place], DO   4 years ago Acute non-recurrent maxillary sinusitis    [facility] [place] Cirri, NP

## 2023-09-23 ENCOUNTER — Ambulatory Visit: Payer: 59 | Admitting: Cardiovascular Disease

## 2023-09-30 ENCOUNTER — Ambulatory Visit: Payer: BC Managed Care – PPO | Admitting: Family Medicine

## 2023-10-16 ENCOUNTER — Ambulatory Visit: Payer: BC Managed Care – PPO | Admitting: General Practice

## 2023-10-16 ENCOUNTER — Other Ambulatory Visit: Payer: Self-pay

## 2023-10-16 ENCOUNTER — Ambulatory Visit
Admission: RE | Admit: 2023-10-16 | Discharge: 2023-10-16 | Disposition: A | Discharge status: Home / Self Care | Payer: BC Managed Care – PPO | Attending: Gastroenterology | Admitting: Gastroenterology

## 2023-10-16 ENCOUNTER — Encounter
Admission: RE | Disposition: A | Discharge status: Home / Self Care | Payer: Self-pay | Source: Home / Self Care | Attending: Gastroenterology

## 2023-10-16 ENCOUNTER — Encounter: Payer: Self-pay | Admitting: Gastroenterology

## 2023-10-16 DIAGNOSIS — Z1211 Encounter for screening for malignant neoplasm of colon: Secondary | ICD-10-CM | POA: Diagnosis present

## 2023-10-16 DIAGNOSIS — I10 Essential (primary) hypertension: Secondary | ICD-10-CM | POA: Diagnosis not present

## 2023-10-16 DIAGNOSIS — Z87891 Personal history of nicotine dependence: Secondary | ICD-10-CM | POA: Insufficient documentation

## 2023-10-16 DIAGNOSIS — K5909 Other constipation: Secondary | ICD-10-CM | POA: Diagnosis not present

## 2023-10-16 DIAGNOSIS — D122 Benign neoplasm of ascending colon: Secondary | ICD-10-CM | POA: Diagnosis not present

## 2023-10-16 HISTORY — PX: POLYPECTOMY: SHX5525

## 2023-10-16 HISTORY — PX: COLONOSCOPY WITH PROPOFOL: SHX5780

## 2023-10-16 LAB — POCT PREGNANCY, URINE: Preg Test, Ur: NEGATIVE

## 2023-10-16 SURGERY — COLONOSCOPY WITH PROPOFOL
Anesthesia: General

## 2023-10-16 MED ORDER — SODIUM CHLORIDE 0.9 % IV SOLN: INTRAVENOUS | Status: DC

## 2023-10-16 MED ORDER — PROPOFOL 500 MG/50ML IV EMUL
INTRAVENOUS | Status: DC | PRN
  Administered 2023-10-16: 150 ug/kg/min via INTRAVENOUS

## 2023-10-16 MED ORDER — PROPOFOL 10 MG/ML IV BOLUS
INTRAVENOUS | Status: AC
  Filled 2023-10-16: qty 20

## 2023-10-16 NOTE — Op Note (Signed)
[facility] Gastroenterology Patient Name: Martha [name] Procedure Date: 10/16/2023 1:59 PM MRN: [id number] Account #: [id number] Date of Birth: 1971-07-12 Admit Type: Outpatient Age: 52 Room: [facility] ENDO ROOM 1 Gender: Female Note Status: Finalized Instrument Name: Peds Colonoscope [id number] Procedure:             Colonoscopy Indications:           Screening for colorectal malignant neoplasm Providers:             Trenda Moots, DO Referring MD:          Marzella Schlein. Bacigalupo (Referring MD) Medicines:             Monitored Anesthesia Care Complications:         No immediate complications. Estimated blood loss:                         Minimal. Procedure:             Pre-Anesthesia Assessment:                        - Prior to the procedure, a History and Physical was                         performed, and patient medications and allergies were                         reviewed. The patient is competent. The risks and                         benefits of the procedure and the sedation options and                         risks were discussed with the patient. All questions                         were answered and informed consent was obtained.                         Patient identification and proposed procedure were                         verified by the physician, the nurse, the anesthetist                         and the technician in the endoscopy suite. Mental                         Status Examination: alert and oriented. Airway                         Examination: normal oropharyngeal airway and neck                         mobility. Respiratory Examination: clear to                         auscultation. CV Examination: RRR, no murmurs, no S3  or S4. Prophylactic Antibiotics: The patient does not                         require prophylactic antibiotics. Prior                         Anticoagulants: The patient has taken no  anticoagulant                         or antiplatelet agents. ASA Grade Assessment: II - A                         patient with mild systemic disease. After reviewing                         the risks and benefits, the patient was deemed in                         satisfactory condition to undergo the procedure. The                         anesthesia plan was to use monitored anesthesia care                         (MAC). Immediately prior to administration of                         medications, the patient was re-assessed for adequacy                         to receive sedatives. The heart rate, respiratory                         rate, oxygen saturations, blood pressure, adequacy of                         pulmonary ventilation, and response to care were                         monitored throughout the procedure. The physical                         status of the patient was re-assessed after the                         procedure.                        After obtaining informed consent, the colonoscope was                         passed under direct vision. Throughout the procedure,                         the patient's blood pressure, pulse, and oxygen                         saturations were monitored continuously. The  Colonoscope was introduced through the anus and                         advanced to the the cecum, identified by appendiceal                         orifice and ileocecal valve. The colonoscopy was                         performed without difficulty. The patient tolerated                         the procedure well. The quality of the bowel                         preparation was evaluated using the BBPS [facility] Bowel                         Preparation Scale) with scores of: Right Colon = 2                         (minor amount of residual staining, small fragments of                         stool and/or opaque liquid, but mucosa seen well),                          Transverse Colon = 2 (minor amount of residual                         staining, small fragments of stool and/or opaque                         liquid, but mucosa seen well) and Left Colon = 2                         (minor amount of residual staining, small fragments of                         stool and/or opaque liquid, but mucosa seen well). The                         total BBPS score equals 6. The quality of the bowel                         preparation was good. The ileocecal valve, appendiceal                         orifice, and rectum were photographed. Findings:      The perianal and digital rectal examinations were normal. Pertinent       negatives include normal sphincter tone.      A 4 to 5 mm polyp was found in the ascending colon. The polyp was       sessile. The polyp was removed with a cold snare. Resection and       retrieval were complete. Estimated blood loss was minimal.  The exam was otherwise without abnormality on direct and retroflexion       views. Impression:            - One 4 to 5 mm polyp in the ascending colon, removed                         with a cold snare. Resected and retrieved.                        - The examination was otherwise normal on direct and                         retroflexion views. Recommendation:        - Patient has a contact number available for                         emergencies. The signs and symptoms of potential                         delayed complications were discussed with the patient.                         Return to normal activities tomorrow. Written                         discharge instructions were provided to the patient.                        - Discharge patient to home.                        - Resume previous diet.                        - Continue present medications.                        - Await pathology results.                        - Repeat colonoscopy for surveillance based on                          pathology results.                        - Return to referring physician as previously                         scheduled.                        - The findings and recommendations were discussed with                         the patient. Procedure Code(s):     --- Professional ---                        [phone], Colonoscopy, flexible; with removal of  tumor(s), polyp(s), or other lesion(s) by snare                         technique Diagnosis Code(s):     --- Professional ---                        Z12.11, Encounter for screening for malignant neoplasm                         of colon                        D12.2, Benign neoplasm of ascending colon CPT copyright 2022 American Medical Association. All rights reserved. The codes documented in this report are preliminary and upon coder review may  be revised to meet current compliance requirements. Attending Participation:      I personally performed the entire procedure. Elfredia Nevins, DO Jaynie Collins DO, DO 10/16/2023 2:37:26 PM This report has been signed electronically. Number of Addenda: 0 Note Initiated On: 10/16/2023 1:59 PM Scope Withdrawal Time: 0 hours 12 minutes 51 seconds  Total Procedure Duration: 0 hours 19 minutes 7 seconds  Estimated Blood Loss:  Estimated blood loss was minimal.      [facility]

## 2023-10-16 NOTE — Transfer of Care (Signed)
Immediate Anesthesia Transfer of Care Note  Patient: Martha [name]  Procedure(s) Performed: COLONOSCOPY WITH PROPOFOL POLYPECTOMY  Patient Location: PACU  Anesthesia Type:General  Level of Consciousness: awake and sedated  Airway & Oxygen Therapy: Patient Spontanous Breathing and Patient connected to nasal cannula oxygen  Post-op Assessment: Report given to RN and Post -op Vital signs reviewed and stable  Post vital signs: Reviewed and stable  Last Vitals:  Vitals Value Taken Time  BP    Temp    Pulse    Resp    SpO2      Last Pain:  Vitals:   10/16/23 1357  TempSrc: Temporal  PainSc: 0-No pain         Complications: There were no known notable events for this encounter.

## 2023-10-16 NOTE — Interval H&P Note (Signed)
History and Physical Interval Note: Preprocedure H&P from 10/16/23  was reviewed and there was no interval change after seeing and examining the patient.  Written consent was obtained from the patient after discussion of risks, benefits, and alternatives. Patient has consented to proceed with Colonoscopy with possible intervention   10/16/2023 2:04 PM  Martha [name]  has presented today for surgery, with the diagnosis of V76.51 (ICD-9-CM) - Z12.11 (ICD-10-CM) - Colon cancer screening.  The various methods of treatment have been discussed with the patient and family. After consideration of risks, benefits and other options for treatment, the patient has consented to  Procedure(s): COLONOSCOPY WITH PROPOFOL (N/A) as a surgical intervention.  The patient's history has been reviewed, patient examined, no change in status, stable for surgery.  I have reviewed the patient's chart and labs.  Questions were answered to the patient's satisfaction.     Jaynie Collins

## 2023-10-16 NOTE — H&P (Signed)
Pre-Procedure H&P   Patient ID: Martha [name] is a 52 y.o. female.  Gastroenterology Provider: Jaynie Collins, DO  Referring Provider: Tawni Pummel, PA PCP: Mick Sell, MD  Date: 10/16/2023  HPI [name] is a 53 y.o. female who presents today for Colonoscopy for initial Colorectal cancer screening .  Patient deals with chronic constipation.  She had a Cologuard negative testing a couple months ago.  No family history of colon cancer or colon polyps.  No melena or hematochezia  Hemoglobin 13.4 MCV 88.5 platelets 218,000   Past Medical History:  Diagnosis Date   ADHD (attention deficit hyperactivity disorder)    Allergy    Anxiety    Atrial tachycardia (HCC)    Carpal tunnel syndrome, bilateral    COVID-19    Depression    Endometrial polyp    Fibromyalgia    Former smoker    college years only (2 years)   GERD (gastroesophageal reflux disease)    History of methicillin resistant staphylococcus aureus (MRSA) 2006   Hypertension    IC (interstitial cystitis)    Insomnia    Interstitial cystitis    Migraine headache    PMB (postmenopausal bleeding)    Raynaud disease    Rheumatoid arthritis (HCC)     Past Surgical History:  Procedure Laterality Date   AUGMENTATION MAMMAPLASTY Bilateral    implants removed   BREAST SURGERY  1995, 2016   CARDIAC ELECTROPHYSIOLOGY STUDY AND ABLATION  09/2020   failed   COSMETIC SURGERY     Breast implants x 2. Remove breast implants removed   CYSTOSCOPY     with antibiotic wash x3   D&C  11/05/2018   Polyps removed - pathology okay   Explant capsulectomy  2019   implants removed   HYSTEROSCOPY WITH D & C N/A 09/05/2023   Procedure: DILATATION AND CURETTAGE /HYSTEROSCOPY;  Surgeon: Christeen Douglas, MD;  Location: [facility] ORS;  Service: Gynecology;  Laterality: N/A;   MEDIAL COLLATERAL LIGAMENT REPAIR, KNEE Right 2017   x3   SPINE SURGERY     Cervial corpectomy and fusion    Family  History No h/o GI disease or malignancy  Review of Systems  Constitutional:  Negative for activity change, appetite change, chills, diaphoresis, fatigue, fever and unexpected weight change.  HENT:  Negative for trouble swallowing and voice change.   Respiratory:  Negative for shortness of breath and wheezing.   Cardiovascular:  Negative for chest pain, palpitations and leg swelling.  Gastrointestinal:  Positive for constipation. Negative for abdominal distention, abdominal pain, anal bleeding, blood in stool, diarrhea, nausea, rectal pain and vomiting.  Musculoskeletal:  Negative for arthralgias and myalgias.  Skin:  Negative for color change and pallor.  Neurological:  Negative for dizziness, syncope and weakness.  Psychiatric/Behavioral:  Negative for confusion.   All other systems reviewed and are negative.    Medications No current facility-administered medications on file prior to encounter.   Current Outpatient Medications on File Prior to Encounter  Medication Sig Dispense Refill   acetaminophen (TYLENOL) 500 MG tablet Take 1,000 mg by mouth every 6 (six) hours as needed for mild pain (pain score 1-3) or headache.     cetirizine (ZYRTEC) 10 MG tablet Take 10 mg by mouth every morning.     HUMIRA PEN 40 MG/0.4ML PNKT Inject 40 mg as directed every 14 (fourteen) days.     pregabalin (LYRICA) 50 MG capsule Take 50 mg by mouth 2 (two) times daily.  TURMERIC PO Take 650-1,300 mg by mouth See admin instructions. Take 1300 mg in the morning and 650 mg at bedtime     valACYclovir (VALTREX) 1000 MG tablet Take 500 mg by mouth as needed (Cold sores).      Pertinent medications related to GI and procedure were reviewed by me with the patient prior to the procedure  No current facility-administered medications for this encounter.      No Active Allergies Allergies were reviewed by me prior to the procedure  Objective   Body mass index is 21.39 kg/m. Vitals:   10/16/23 1357   BP: (!) 144/85  Pulse: 68  Resp: 16  Temp: (!) 97.1 F (36.2 C)  TempSrc: Temporal  SpO2: 100%  Weight: 62 kg  Height: 5\' 7"  (1.702 m)     Physical Exam Vitals and nursing note reviewed.  Constitutional:      General: She is not in acute distress.    Appearance: Normal appearance. She is not ill-appearing, toxic-appearing or diaphoretic.  HENT:     Head: Normocephalic and atraumatic.     Nose: Nose normal.     Mouth/Throat:     Mouth: Mucous membranes are moist.     Pharynx: Oropharynx is clear.  Eyes:     General: No scleral icterus.    Extraocular Movements: Extraocular movements intact.  Cardiovascular:     Rate and Rhythm: Normal rate and regular rhythm.     Heart sounds: Normal heart sounds. No murmur heard.    No friction rub. No gallop.  Pulmonary:     Effort: Pulmonary effort is normal. No respiratory distress.     Breath sounds: Normal breath sounds. No wheezing, rhonchi or rales.  Abdominal:     General: Bowel sounds are normal. There is no distension.     Palpations: Abdomen is soft.     Tenderness: There is no abdominal tenderness. There is no guarding or rebound.  Musculoskeletal:     Cervical back: Neck supple.     Right lower leg: No edema.     Left lower leg: No edema.  Skin:    General: Skin is warm and dry.     Coloration: Skin is not jaundiced or pale.  Neurological:     General: No focal deficit present.     Mental Status: She is alert and oriented to person, place, and time. Mental status is at baseline.  Psychiatric:        Mood and Affect: Mood normal.        Behavior: Behavior normal.        Thought Content: Thought content normal.        Judgment: Judgment normal.      Assessment:  [name] is a 52 y.o. female  who presents today for Colonoscopy for Colorectal cancer screening .  Plan:  Colonoscopy with possible intervention today  Colonoscopy with possible biopsy, control of bleeding, polypectomy, and interventions  as necessary has been discussed with the patient/patient representative. Informed consent was obtained from the patient/patient representative after explaining the indication, nature, and risks of the procedure including but not limited to death, bleeding, perforation, missed neoplasm/lesions, cardiorespiratory compromise, and reaction to medications. Opportunity for questions was given and appropriate answers were provided. Patient/patient representative has verbalized understanding is amenable to undergoing the procedure.   Jaynie Collins, DO  [facility] Gastroenterology  Portions of the record may have been created with voice recognition software. Occasional wrong-word or 'sound-a-like' substitutions may  have occurred due to the inherent limitations of voice recognition software.  Read the chart carefully and recognize, using context, where substitutions may have occurred.

## 2023-10-16 NOTE — Anesthesia Postprocedure Evaluation (Signed)
Anesthesia Post Note  Patient: Martha [name]  Procedure(s) Performed: COLONOSCOPY WITH PROPOFOL POLYPECTOMY  Patient location during evaluation: Endoscopy Anesthesia Type: General Level of consciousness: awake and alert Pain management: pain level controlled Vital Signs Assessment: post-procedure vital signs reviewed and stable Respiratory status: spontaneous breathing, nonlabored ventilation, respiratory function stable and patient connected to nasal cannula oxygen Cardiovascular status: blood pressure returned to baseline and stable Postop Assessment: no apparent nausea or vomiting Anesthetic complications: no   There were no known notable events for this encounter.   Last Vitals:  Vitals:   10/16/23 1435 10/16/23 1445  BP: (!) 100/53 112/60  Pulse: 68 66  Resp: 18 16  Temp: (!) 35.9 C   SpO2: 100% 99%    Last Pain:  Vitals:   10/16/23 1435  TempSrc: Temporal  PainSc: 0-No pain                 Cleda Mccreedy Samari Bittinger

## 2023-10-16 NOTE — Anesthesia Preprocedure Evaluation (Signed)
Anesthesia Evaluation  Patient identified by MRN, date of birth, ID band Patient awake    Reviewed: Allergy & Precautions, NPO status , Patient's Chart, lab work & pertinent test results  History of Anesthesia Complications Negative for: history of anesthetic complications  Airway Mallampati: III  TM Distance: <3 FB Neck ROM: full    Dental  (+) Chipped   Pulmonary neg shortness of breath, former smoker   Pulmonary exam normal        Cardiovascular Exercise Tolerance: Good hypertension, (-) angina Normal cardiovascular exam     Neuro/Psych  Headaches  Neuromuscular disease  negative psych ROS   GI/Hepatic Neg liver ROS,GERD  Controlled,,  Endo/Other  negative endocrine ROS    Renal/GU negative Renal ROS  negative genitourinary   Musculoskeletal   Abdominal   Peds  Hematology negative hematology ROS (+)   Anesthesia Other Findings Past Medical History: No date: ADHD (attention deficit hyperactivity disorder) No date: Allergy No date: Anxiety No date: Atrial tachycardia (HCC) No date: Carpal tunnel syndrome, bilateral No date: COVID-19 No date: Depression No date: Endometrial polyp No date: Fibromyalgia No date: Former smoker     Comment:  college years only (2 years) No date: GERD (gastroesophageal reflux disease) 2006: History of methicillin resistant staphylococcus aureus (MRSA) No date: Hypertension No date: IC (interstitial cystitis) No date: Insomnia No date: Interstitial cystitis No date: Migraine headache No date: PMB (postmenopausal bleeding) No date: Raynaud disease No date: Rheumatoid arthritis (HCC)  Past Surgical History: No date: AUGMENTATION MAMMAPLASTY; Bilateral     Comment:  implants removed 1995, 2016: BREAST SURGERY 09/2020: CARDIAC ELECTROPHYSIOLOGY STUDY AND ABLATION     Comment:  failed No date: COSMETIC SURGERY     Comment:  Breast implants x 2. Remove breast implants  removed No date: CYSTOSCOPY     Comment:  with antibiotic wash x3 11/05/2018: D&C     Comment:  Polyps removed - pathology okay 2019: Explant capsulectomy     Comment:  implants removed 09/05/2023: HYSTEROSCOPY WITH D & C; N/A     Comment:  Procedure: DILATATION AND CURETTAGE /HYSTEROSCOPY;                Surgeon: Christeen Douglas, MD;  Location: [facility] ORS;                Service: Gynecology;  Laterality: N/A; 2017: MEDIAL COLLATERAL LIGAMENT REPAIR, KNEE; Right     Comment:  x3 No date: SPINE SURGERY     Comment:  Cervial corpectomy and fusion     Reproductive/Obstetrics negative OB ROS                             Anesthesia Physical Anesthesia Plan  ASA: 2  Anesthesia Plan: General   Post-op Pain Management:    Induction: Intravenous  PONV Risk Score and Plan: Propofol infusion and TIVA  Airway Management Planned: Natural Airway and Nasal Cannula  Additional Equipment:   Intra-op Plan:   Post-operative Plan:   Informed Consent: I have reviewed the patients History and Physical, chart, labs and discussed the procedure including the risks, benefits and alternatives for the proposed anesthesia with the patient or authorized representative who has indicated his/her understanding and acceptance.     Dental Advisory Given  Plan Discussed with: Anesthesiologist, CRNA and Surgeon  Anesthesia Plan Comments: (Patient consented for risks of anesthesia including but not limited to:  - adverse reactions to medications - risk of airway  placement if required - damage to eyes, teeth, lips or other oral mucosa - nerve damage due to positioning  - sore throat or hoarseness - Damage to heart, brain, nerves, lungs, other parts of body or loss of life  Patient voiced understanding and assent.)       Anesthesia Quick Evaluation

## 2023-10-17 ENCOUNTER — Encounter: Payer: Self-pay | Admitting: Gastroenterology

## 2023-10-17 LAB — SURGICAL PATHOLOGY

## 2023-11-26 ENCOUNTER — Ambulatory Visit
Admission: RE | Admit: 2023-11-26 | Discharge: 2023-11-26 | Disposition: A | Discharge status: Home / Self Care | Payer: 59 | Source: Ambulatory Visit | Attending: Obstetrics and Gynecology | Admitting: Obstetrics and Gynecology

## 2023-11-26 DIAGNOSIS — Z1231 Encounter for screening mammogram for malignant neoplasm of breast: Secondary | ICD-10-CM | POA: Insufficient documentation

## 2024-08-10 ENCOUNTER — Encounter: Payer: Self-pay | Admitting: *Deleted

## 2024-09-03 ENCOUNTER — Other Ambulatory Visit: Payer: Self-pay | Admitting: Obstetrics and Gynecology

## 2024-09-03 DIAGNOSIS — Z1231 Encounter for screening mammogram for malignant neoplasm of breast: Secondary | ICD-10-CM

## 2024-11-08 ENCOUNTER — Other Ambulatory Visit: Payer: Self-pay | Admitting: Obstetrics and Gynecology

## 2024-11-08 DIAGNOSIS — Z1231 Encounter for screening mammogram for malignant neoplasm of breast: Secondary | ICD-10-CM

## 2024-12-01 ENCOUNTER — Encounter: Payer: Self-pay | Admitting: Cardiovascular Disease

## 2024-12-01 NOTE — Progress Notes (Unsigned)
 " Cardiology Office Note   Date:  12/02/2024  ID:  Martha [name], DOB Feb 11, 1971, MRN [id number] PCP: Epifanio Alm SQUIBB, MD  [facility] Providers Cardiologist:  Evalene Lunger, MD   History of Present Illness Martha [name] is a 54 y.o. female ith a hx of former smoker, atrial tachycardia, Rheumatoid arthritis, chronic pain, COVID in September 2022 who presents for HTN.   Previously seen by [facility] for syncope. PACs noted. Started on metoprolol . Echo was normal.     Seen by EP 2021 for PACs started on flecainide  50mg  BID. She went for ablation but it was no felt she had inducible SVT. She was started on diltiazem  for persistent symptoms.    Echo September 2022 showed normal LVEF, normal RVSF, mild MR   ER visit May 2023 for chest pain/tachycardia. BP was up and bystolic  was refilled. She did not tolerate flecainide  or diltiazem .   Cardiac CTA in October 2024 showed no significant coronary disease, calcium score 0.  Echocardiogram September 2024 showed normal pump function.  Patient was last seen 08/25/2023 and was overall stable from a cardiac perspective.  Today, the patient was doing well until a week ago. A week ago, she started having episodes of diaphoresis, chest tightness and feeling like she was going to throw up. She checked her BP and systolics were 170s-180s. She increase Coreg  from 12.5 twice daily to Coreg  12.5 three times a day. It seemed to continue to be high. She feels that when the bad weather started happening she started eating bad and not exercising. She was eating a lot of salt.  She also started drinking alcohol twice a week.  Feels anxiety got worse as well. She is also going through menopause. She is unsure if this could also be an arrythmia.   Studies Reviewed EKG Interpretation Date/Time:  Thursday December 02 2024 10:23:01 EST Ventricular Rate:  62 PR Interval:  150 QRS Duration:  82 QT Interval:  406 QTC Calculation: 412 R Axis:   37  Text  Interpretation: Normal sinus rhythm Normal ECG When compared with ECG of 01-Jul-2023 16:22, No significant change was found Confirmed by Franchester, Drury Ardizzone ([id number]) on 12/02/2024 10:26:41 AM    Cardiac CTA 07/2023  IMPRESSION: 1. Coronary calcium score of 0.   2. Normal coronary origin with right dominance.   3. No evidence of CAD.   4. CAD-RADS 0. Consider non-atherosclerotic causes of chest pain.  Echo 06/2023 1. Left ventricular ejection fraction, by estimation, is 60 to 65%. The  left ventricle has normal function. The left ventricle has no regional  wall motion abnormalities. Left ventricular diastolic parameters were  normal. The average left ventricular  global longitudinal strain is -19.3 %. The global longitudinal strain is  normal.   2. Right ventricular systolic function is normal. The right ventricular  size is normal. There is normal pulmonary artery systolic pressure. The  estimated right ventricular systolic pressure is 24.3 mmHg.   3. Left atrial size was mildly dilated.   4. The mitral valve is abnormal wtih mild bileaflet prolapse. Mild mitral  valve regurgitation. No evidence of mitral stenosis.   5. The aortic valve is tricuspid. Aortic valve regurgitation is not  visualized. No aortic stenosis is present.   6. The inferior vena cava is normal in size with greater than 50%  respiratory variability, suggesting right atrial pressure of 3 mmHg.        Physical Exam VS:  BP 118/68 (BP Location: Left Arm, Patient  Position: Sitting)   Pulse 62   Ht 5' 7 (1.702 m)   Wt 153 lb 3.2 oz (69.5 kg)   SpO2 99%   BMI 23.99 kg/m        Wt Readings from Last 3 Encounters:  12/02/24 153 lb 3.2 oz (69.5 kg)  10/16/23 136 lb 9.6 oz (62 kg)  09/05/23 134 lb (60.8 kg)    GEN: Well nourished, well developed in no acute distress NECK: No JVD; No carotid bruits CARDIAC: RRR, no murmurs, rubs, gallops RESPIRATORY:  Clear to auscultation without rales, wheezing or rhonchi   ABDOMEN: Soft, non-tender, non-distended EXTREMITIES:  No edema; No deformity   ASSESSMENT AND PLAN  Chest pain, diaphoresis, fatigue HTN Patient reports episodes of chest discomfort, diaphoresis, nausea, and overall generalized fatigue.  Symptoms started about a week ago once the weather got bad.  Prior to this she was exercising, eating clean, and off social media.  Once the weather got bad, she started eating poorly, not exercising, and on social media for hours a day.  She has been eating very salty food.  She started having episodes as described above and this was similar to when blood pressure was high.  She was checking blood pressure at home and readings were 170s 180s.  She was also wondering if her arrhythmia has returned.  EKG today shows normal sinus rhythm with no ischemic changes.  Cardiac CTA in 2024 showed no CAD.  Echo showed normal pump function and mild MR.  She was taking Coreg  12.5 mg twice a day, and then increased it to 3 times a day.  Blood pressure today is 118/68, repeat blood pressure similar.  Of note, patient is going through menopause.  Also, anxiety may be contributing to symptoms.  I recommended she take Coreg  12.5 mg twice daily.  I will add on amlodipine  5 mg daily.  I recommended she take blood pressure 2 hours after her medications.  Patient plans to get back to exercising regularly and clean eating.  We will see her back in 2 months to reassess blood pressure and symptoms.  Chest pain Cardiac CTA in 2024 showed coronary calcium score of 0, no CAD.  Suspect symptoms are from elevated blood pressures.  We discussed ischemic testing at this time, but will reassess this at follow-up.  Paroxysmal atrial tachycardia Patient questioning whether her symptoms are related to arrhythmia.  EKG today shows normal sinus rhythm with a heart rate of 62 bpm.  She denies palpitations.  Plan as above.  At follow-up we will reassess need for heart monitor.  Rheumatoid arthritis No  exacerbations this year.  Anxiety Possibly contributing to symptoms.        Dispo: Follow-up in 2 months  Signed, Kinzly Pierrelouis VEAR Fishman, PA-C   "

## 2024-12-01 NOTE — Telephone Encounter (Signed)
 Called patient - scheduled for OV as she has not been seen since 2024

## 2024-12-02 ENCOUNTER — Ambulatory Visit: Payer: Self-pay | Admitting: Medical

## 2024-12-02 ENCOUNTER — Encounter: Payer: Self-pay | Admitting: Medical

## 2024-12-02 VITALS — BP 118/68 | HR 62 | Ht 67.0 in | Wt 153.2 lb

## 2024-12-02 DIAGNOSIS — F411 Generalized anxiety disorder: Secondary | ICD-10-CM | POA: Diagnosis not present

## 2024-12-02 DIAGNOSIS — M0579 Rheumatoid arthritis with rheumatoid factor of multiple sites without organ or systems involvement: Secondary | ICD-10-CM

## 2024-12-02 DIAGNOSIS — I10 Essential (primary) hypertension: Secondary | ICD-10-CM

## 2024-12-02 DIAGNOSIS — R079 Chest pain, unspecified: Secondary | ICD-10-CM | POA: Diagnosis not present

## 2024-12-02 DIAGNOSIS — Z79899 Other long term (current) drug therapy: Secondary | ICD-10-CM

## 2024-12-02 DIAGNOSIS — R072 Precordial pain: Secondary | ICD-10-CM

## 2024-12-02 DIAGNOSIS — I4719 Other supraventricular tachycardia: Secondary | ICD-10-CM | POA: Diagnosis not present

## 2024-12-02 MED ORDER — AMLODIPINE BESYLATE 5 MG PO TABS: 5.0000 mg | ORAL_TABLET | Freq: Every day | ORAL | 3 refills | Status: AC

## 2024-12-02 NOTE — Patient Instructions (Signed)
 Medication Instructions:   Start Amlodipine  5 MG daily.   *If you need a refill on your cardiac medications before your next appointment, please call your pharmacy*  Lab Work: Your provider would like for you to have following labs drawn today CMET, CBC, TSH and Magnesium.   If you have labs (blood work) drawn today and your tests are completely normal, you will receive your results only by: MyChart Message (if you have MyChart) OR A paper copy in the mail If you have any lab test that is abnormal or we need to change your treatment, we will call you to review the results.  Testing/Procedures: No test ordered today   Follow-Up: At [facility], you and your health needs are our priority.  As part of our continuing mission to provide you with exceptional heart care, our providers are all part of one team.  This team includes your primary Cardiologist (physician) and Advanced Practice Providers or APPs (Physician Assistants and Nurse Practitioners) who all work together to provide you with the care you need, when you need it.  Your next appointment:   2 month(s)  Provider:   You may see Timothy Gollan, MD or one of the following Advanced Practice Providers on your designated Care Team:   Lonni Meager, NP Lesley Maffucci, PA-C Bernardino Bring, PA-C Cadence [place]ains[place], PA-C Tylene Lunch, NP Barnie Hila, NP    We recommend signing up for the patient portal called MyChart.  Sign up information is provided on this After Visit Summary.  MyChart is used to connect with patients for Virtual Visits (Telemedicine).  Patients are able to view lab/test results, encounter notes, upcoming appointments, etc.  Non-urgent messages can be sent to your provider as well.   To learn more about what you can do with MyChart, go to [email or web address].

## 2024-12-03 ENCOUNTER — Ambulatory Visit: Payer: Self-pay | Admitting: Medical

## 2024-12-03 LAB — CBC
Hematocrit: 38.8 % (ref 34.0–46.6)
Hemoglobin: 13 g/dL (ref 11.1–15.9)
MCH: 31.7 pg (ref 26.6–33.0)
MCHC: 33.5 g/dL (ref 31.5–35.7)
MCV: 95 fL (ref 79–97)
Platelets: 209 10*3/uL (ref 150–450)
RBC: 4.1 x10E6/uL (ref 3.77–5.28)
RDW: 12.5 % (ref 11.7–15.4)
WBC: 6.7 10*3/uL (ref 3.4–10.8)

## 2024-12-03 LAB — COMPREHENSIVE METABOLIC PANEL WITH GFR
ALT: 15 [IU]/L (ref 0–32)
AST: 20 [IU]/L (ref 0–40)
Albumin: 4.1 g/dL (ref 3.8–4.9)
Alkaline Phosphatase: 41 [IU]/L — ABNORMAL LOW (ref 49–135)
BUN/Creatinine Ratio: 17 (ref 9–23)
BUN: 12 mg/dL (ref 6–24)
Bilirubin Total: 0.4 mg/dL (ref 0.0–1.2)
CO2: 22 mmol/L (ref 20–29)
Calcium: 9.1 mg/dL (ref 8.7–10.2)
Chloride: 104 mmol/L (ref 96–106)
Creatinine, Ser: 0.69 mg/dL (ref 0.57–1.00)
Globulin, Total: 2.6 g/dL (ref 1.5–4.5)
Glucose: 92 mg/dL (ref 70–99)
Potassium: 4.5 mmol/L (ref 3.5–5.2)
Sodium: 139 mmol/L (ref 134–144)
Total Protein: 6.7 g/dL (ref 6.0–8.5)
eGFR: 104 mL/min/{1.73_m2}

## 2024-12-03 LAB — MAGNESIUM: Magnesium: 2.1 mg/dL (ref 1.6–2.3)

## 2024-12-03 LAB — TSH: TSH: 1.2 u[IU]/mL (ref 0.450–4.500)

## 2024-12-07 ENCOUNTER — Ambulatory Visit

## 2025-02-03 ENCOUNTER — Ambulatory Visit: Admitting: Medical
